# Patient Record
Sex: Female | Born: 1965 | Hispanic: Yes | Marital: Single | Smoking: Never smoker
Health system: Southern US, Community
[De-identification: ages and names within clinical notes are randomized; demographics above are authoritative.]

## PROBLEM LIST (undated history)

## (undated) DIAGNOSIS — D649 Anemia, unspecified: Secondary | ICD-10-CM

## (undated) DIAGNOSIS — M0600 Rheumatoid arthritis without rheumatoid factor, unspecified site: Secondary | ICD-10-CM

## (undated) DIAGNOSIS — R519 Headache, unspecified: Secondary | ICD-10-CM

## (undated) DIAGNOSIS — R51 Headache: Secondary | ICD-10-CM

## (undated) DIAGNOSIS — T8859XA Other complications of anesthesia, initial encounter: Secondary | ICD-10-CM

## (undated) DIAGNOSIS — I10 Essential (primary) hypertension: Secondary | ICD-10-CM

## (undated) DIAGNOSIS — E785 Hyperlipidemia, unspecified: Secondary | ICD-10-CM

## (undated) DIAGNOSIS — T4145XA Adverse effect of unspecified anesthetic, initial encounter: Secondary | ICD-10-CM

## (undated) DIAGNOSIS — G971 Other reaction to spinal and lumbar puncture: Secondary | ICD-10-CM

## (undated) HISTORY — PX: OOPHORECTOMY: SHX86

## (undated) HISTORY — DX: Hyperlipidemia, unspecified: E78.5

## (undated) HISTORY — DX: Rheumatoid arthritis without rheumatoid factor, unspecified site: M06.00

## (undated) HISTORY — PX: APPENDECTOMY: SHX54

---

## 1999-05-17 ENCOUNTER — Other Ambulatory Visit: Admission: RE | Admit: 1999-05-17 | Discharge: 1999-05-17 | Payer: Self-pay | Admitting: Obstetrics

## 1999-08-28 ENCOUNTER — Encounter (INDEPENDENT_AMBULATORY_CARE_PROVIDER_SITE_OTHER): Payer: Self-pay | Admitting: Specialist

## 1999-08-28 ENCOUNTER — Inpatient Hospital Stay (HOSPITAL_COMMUNITY): Admission: AD | Admit: 1999-08-28 | Discharge: 1999-08-30 | Payer: Self-pay | Admitting: Obstetrics

## 2009-10-05 ENCOUNTER — Inpatient Hospital Stay (HOSPITAL_COMMUNITY)
Admission: EM | Admit: 2009-10-05 | Discharge: 2009-10-07 | Payer: Self-pay | Source: Home / Self Care | Admitting: Emergency Medicine

## 2009-10-05 DIAGNOSIS — N12 Tubulo-interstitial nephritis, not specified as acute or chronic: Secondary | ICD-10-CM | POA: Insufficient documentation

## 2009-10-05 DIAGNOSIS — E781 Pure hyperglyceridemia: Secondary | ICD-10-CM | POA: Insufficient documentation

## 2009-10-06 LAB — CONVERTED CEMR LAB
ALT: 29 units/L
CO2: 19 meq/L
Calcium: 8.4 mg/dL
Chloride: 108 meq/L
Potassium: 3.5 meq/L
Sodium: 137 meq/L
TSH: 1.633 microintl units/mL
Total Bilirubin: 0.3 mg/dL
Total Protein: 6.3 g/dL

## 2009-10-07 LAB — CONVERTED CEMR LAB
Basophils Absolute: 0 10*3/uL
HDL: 29 mg/dL
Lymphocytes Relative: 22 %
Neutro Abs: 4.8 10*3/uL
Neutrophils Relative %: 64 %
Platelets: 188 10*3/uL
RDW: 14.3 %
Total CHOL/HDL Ratio: 4.9
Triglycerides: 237 mg/dL
VLDL: 47 mg/dL

## 2009-10-27 ENCOUNTER — Encounter (INDEPENDENT_AMBULATORY_CARE_PROVIDER_SITE_OTHER): Payer: Self-pay | Admitting: Nurse Practitioner

## 2009-10-27 ENCOUNTER — Ambulatory Visit: Payer: Self-pay | Admitting: Internal Medicine

## 2009-10-27 DIAGNOSIS — E119 Type 2 diabetes mellitus without complications: Secondary | ICD-10-CM | POA: Insufficient documentation

## 2009-10-27 DIAGNOSIS — M79609 Pain in unspecified limb: Secondary | ICD-10-CM | POA: Insufficient documentation

## 2009-10-27 DIAGNOSIS — D649 Anemia, unspecified: Secondary | ICD-10-CM | POA: Insufficient documentation

## 2009-10-27 LAB — CONVERTED CEMR LAB
Glucose, Urine, Semiquant: 100
Ketones, urine, test strip: NEGATIVE
Nitrite: NEGATIVE
Specific Gravity, Urine: <1.005
WBC Urine, dipstick: NEGATIVE

## 2009-12-05 ENCOUNTER — Ambulatory Visit: Payer: Self-pay | Admitting: Nurse Practitioner

## 2009-12-05 DIAGNOSIS — I10 Essential (primary) hypertension: Secondary | ICD-10-CM | POA: Insufficient documentation

## 2009-12-05 LAB — CONVERTED CEMR LAB
Bilirubin Urine: NEGATIVE
Microalb, Ur: 0.5 mg/dL (ref 0.00–1.89)
Protein, U semiquant: NEGATIVE
Urobilinogen, UA: 0.2

## 2010-01-18 ENCOUNTER — Ambulatory Visit: Payer: Self-pay | Admitting: Nurse Practitioner

## 2010-01-18 ENCOUNTER — Encounter: Payer: Self-pay | Admitting: Nurse Practitioner

## 2010-01-18 ENCOUNTER — Encounter (INDEPENDENT_AMBULATORY_CARE_PROVIDER_SITE_OTHER): Payer: Self-pay | Admitting: Nurse Practitioner

## 2010-02-17 ENCOUNTER — Encounter (INDEPENDENT_AMBULATORY_CARE_PROVIDER_SITE_OTHER): Payer: Self-pay | Admitting: Nurse Practitioner

## 2010-02-17 ENCOUNTER — Other Ambulatory Visit: Payer: Self-pay | Admitting: Nurse Practitioner

## 2010-02-17 ENCOUNTER — Ambulatory Visit
Admission: RE | Admit: 2010-02-17 | Discharge: 2010-02-17 | Payer: Self-pay | Source: Home / Self Care | Attending: Nurse Practitioner | Admitting: Nurse Practitioner

## 2010-02-17 LAB — CONVERTED CEMR LAB
Bilirubin Urine: NEGATIVE
Blood Glucose, Fingerstick: 124
Glucose, Urine, Semiquant: NEGATIVE
Protein, U semiquant: NEGATIVE
Specific Gravity, Urine: <1.005
pH: 5.5

## 2010-02-20 ENCOUNTER — Encounter (INDEPENDENT_AMBULATORY_CARE_PROVIDER_SITE_OTHER): Payer: Self-pay | Admitting: Nurse Practitioner

## 2010-02-21 ENCOUNTER — Ambulatory Visit (HOSPITAL_COMMUNITY)
Admission: RE | Admit: 2010-02-21 | Discharge: 2010-02-21 | Payer: Self-pay | Source: Home / Self Care | Attending: Internal Medicine | Admitting: Internal Medicine

## 2010-02-28 ENCOUNTER — Other Ambulatory Visit: Payer: Self-pay | Admitting: Internal Medicine

## 2010-02-28 DIAGNOSIS — R928 Other abnormal and inconclusive findings on diagnostic imaging of breast: Secondary | ICD-10-CM

## 2010-02-28 NOTE — Letter (Signed)
Summary: PT INFORMATION SHEET  PT INFORMATION SHEET   Imported By: Arta Bruce 10/28/2009 15:33:06  _____________________________________________________________________  External Attachment:    Type:   Image     Comment:   External Document

## 2010-02-28 NOTE — Assessment & Plan Note (Signed)
Summary: NEW - Establish Care   Vital Signs:  Patient profile:   45 year old female LMP:     10/08/2009 Height:      61.50 inches Weight:      148.4 pounds BMI:     27.69 Temp:     97.1 degrees F oral Pulse rate:   72 / minute Pulse rhythm:   regular Resp:     16 per minute BP sitting:   126 / 78  (left arm) Cuff size:   regular  Vitals Entered By: Levon Hedger (October 27, 2009 11:24 AM)  Nutrition Counseling: Patient's BMI is greater than 25 and therefore counseled on weight management options.  Diabetic Foot Exam Foot Inspection Is there a history of a foot ulcer?              No Is there a foot ulcer now?              No Can the patient see the bottom of their feet?          Yes Are the shoes appropriate in style and fit?          Yes Is there swelling or an abnormal foot shape?          No Are the toenails long?                No Are the toenails thick?                No Are the toenails ingrown?              No Is there heavy callous build-up?              No Is there pain in the calf muscle (Intermittent claudication) when walking?    NoIs there a claw toe deformity?              No Is there elevated skin temperature?            No Is there limited ankle dorsiflexion?            No Is there foot or ankle muscle weakness?            No  Diabetic Foot Care Education Patient educated on appropriate care of diabetic feet.  Pulse Check          Right Foot          Left Foot Dorsalis Pedis:        normal            normal    10-g (5.07) Semmes-Weinstein Monofilament Test           Right Foot          Left Foot Visual Inspection               Test Control      normal         normal Site 1         normal         normal Site 2         normal         normal Site 3         normal         normal Site 4         normal         normal Site 5         normal  normal Site 6         normal         normal Site 7         normal         normal Site 8         normal          normal Site 9         normal         normal Site 10         normal         normal  Impression      normal         normal  Legend:  Site 1 = Plantar aspect of first toe (center of pad) Site 2 = Plantar aspect of third toe (center of pad) Site 3 = Plantar aspect of fifth toe (center of pad) Site 4 = Plantar aspect of first metatarsal head Site 5 = Plantar aspect of third metatarsal head Site 6 = Plantar aspect of fifth metatarsal head Site 7 = Plantar aspect of medial midfoot Site 8 = Plantar aspect of lateral midfoot Site 9 = Plantar aspect of heel Site 10 = dorsal aspect of foot between the base of the first and second toes   Result is Abnormal if patient was unable to perceive the monofilament at site indicated.   CC: follow-up visit [facility]...new diabetic...pt states she has discomfort in heel of left foot, Lipid Management Is Patient Diabetic? Yes Pain Assessment Patient in pain? no      CBG Result 107 CBG Device ID [id number]  Does patient need assistance? Functional Status Self care Ambulation Normal LMP (date): 10/08/2009     Enter LMP: 10/08/2009   CC:  follow-up visit [facility]...new diabetic...pt states she has discomfort in heel of left foot and Lipid Management.  History of Present Illness:  Pt into the office to establish care. No previous PCP  Hospitalized from 10/05/2009 to 10/07/2009 (full hospital d/c reviewed) Pt presented to the ER with pain in her back.  She also had fever. Admits to polyuria and polydipsia.   1. Sepsis secondary to E. coli pyelonephritis - urine culture sensitive to E. coli and pt was started on cipro.  Total course for 2 weeks however pt returns today with 1/2 bottle of meds.  states that the medication gave her headaches so she only took 1 tablet by mouth daily.  2.  Rhabdomylysis - most likely due to sepsis.  no deterioration of her renal function  and CK normalized  3.  Dehydration with tachy - HR 160's in the ER.  She was given rate  controlling medications without much success in bringing down the heart rate.    4.  Type 2 diabetes - done in the hospital.  Pt does have a glucose meter and she has been checking blood sugar before bedtime.  She has been taking metformin 500mg  by mouth daily instead of 1/2 tablet by mouth two times a day as instructed  5.  Hypertriglyceridemia - diet control with low fat, diet and exericise.  6.  Mild normocytic anemia - menstruating female, most likely due to menstrual loss.  no workup done while in the hospital  Pt present with daughter today during the visit who served as the interpreter.  Diabetes Management History:      The patient is a 45 years old female who comes in for evaluation of DM Type 2.  She has not been  enrolled in the "Diabetic Education Program".  She states lack of understanding of dietary principles and is not following her diet appropriately.  No sensory loss is reported.  Self foot exams are not being performed.  She is checking home blood sugars.  She says that she is not exercising regularly.        Hypoglycemic symptoms are not occurring.  No hyperglycemic symptoms are reported.        There are no symptoms to suggest diabetic complications.  The following changes have been made to her treatment plan since last visit: medication changes.  Treatment plan changes were initiated by patient.    Lipid Management History:      Positive NCEP/ATP III risk factors include diabetes and HDL cholesterol less than 40.  Negative NCEP/ATP III risk factors include female age less than 64 years old, non-tobacco-user status, non-hypertensive, no ASHD (atherosclerotic heart disease), no prior stroke/TIA, and no peripheral vascular disease.     Habits & Providers  Alcohol-Tobacco-Diet     Alcohol drinks/day: 0     Tobacco Status: never  Exercise-Depression-Behavior     Does Patient Exercise: no     Have you felt down or hopeless? no     Have you felt little pleasure in things?  no     Drug Use: never  Current Medications (verified): 1)  Cipro 500 Mg Tabs (Ciprofloxacin Hcl) .... Take One Tablet By Mouth Twice Daily *christina Rama 2)  Metformin Hcl 500 Mg Tabs (Metformin Hcl) .... Take One Half Tablet By Mouth Twice Daily *christina Rama  Allergies (verified): No Known Drug Allergies  Family History: mother - diabetes, htn father - deceased from lung disease  sister - diabetes  Social History: 2 children separted tobacco - none ETOH - none Drug - noneDrug Use:  never Smoking Status:  never Does Patient Exercise:  no  Review of Systems CV:  Denies fatigue. Resp:  Denies cough. GI:  Denies abdominal pain, nausea, and vomiting. MS:  left heel pain. Endo:  Complains of excessive thirst and excessive urination.  Physical Exam  General:  alert.   Head:  normocephalic.   Lungs:  normal breath sounds.   Heart:  normal rate and regular rhythm.   Abdomen:  normal bowel sounds.   Msk:  up to the exam table Neurologic:  alert & oriented X3.   Skin:  color normal.   Psych:  Oriented X3.    Diabetes Management Exam:    Foot Exam (with socks and/or shoes not present):       Sensory-Monofilament:          Left foot: normal          Right foot: normal       Inspection:          Left foot: normal          Right foot: normal       Nails:          Left foot: normal          Right foot: normal   Impression & Recommendations:  Problem # 1:  DIABETES MELLITUS, TYPE II (ICD-250.00) will refer to Susie piper diabetes educator advise pt to check blood sugar in the morning before breakfast flu vaccine given today Her updated medication list for this problem includes:    Metformin Hcl 500 Mg Tabs (Metformin hcl) .Marland Kitchen... 1/2 tablet by two times a day for diabetes  Orders: Capillary Blood Glucose/CBG (16109) Misc. Referral (Misc.  Ref)  Problem # 2:  HYPERTRIGLYCERIDEMIA (ICD-272.1) labs reviewed from hospital will recheck in 6 months  Problem # 3:   ANEMIA (ICD-285.9) will rechck on next visit  Problem # 4:  HEEL PAIN, LEFT (ICD-729.5) advised pt to place inserts in her shoes will give ibuprofen as needed   Complete Medication List: 1)  Cipro 500 Mg Tabs (Ciprofloxacin hcl) .... Take one tablet by mouth twice daily *christina rama 2)  Metformin Hcl 500 Mg Tabs (Metformin hcl) .... 1/2 tablet by two times a day for diabetes 3)  Ibuprofen 600 Mg Tabs (Ibuprofen) .... One tablet by mouth two times a day as needed for heel pain  Diabetes Management Assessment/Plan:      The following lipid goals have been established for the patient: Total cholesterol goal of 200; LDL cholesterol goal of 100; HDL cholesterol goal of 40; Triglyceride goal of 150.  Her blood pressure goal is < 130/80.    Lipid Assessment/Plan:      Based on NCEP/ATP III, the patient's risk factor category is "history of diabetes".  The patient's lipid goals are as follows: Total cholesterol goal is 200; LDL cholesterol goal is 100; HDL cholesterol goal is 40; Triglyceride goal is 150.    Patient Instructions: 1)  Schedule an appointment with Susie Piper for diabetes. 2)  Schedule an eligibility appointment to get an orange card 3)  Schedule a follow up appointment with [email or web address] after your eligibility appointment for diabetes. 4)  you will need pneumovax, u/a, microalbumin, cbg, foot check. 5)  Check your blood sugar daily before breakfast  Diabetic Foot Exam Foot Inspection Is there a history of a foot ulcer?              No Is there a foot ulcer now?              No Can the patient see the bottom of their feet?          Yes Are the shoes appropriate in style and fit?          Yes Is there swelling or an abnormal foot shape?          No Are the toenails long?                No Are the toenails thick?                No Are the toenails ingrown?              No Is there heavy callous build-up?              No Is there pain in the calf muscle (Intermittent  claudication) when walking?    NoIs there a claw toe deformity?              No Is there elevated skin temperature?            No Is there limited ankle dorsiflexion?            No Is there foot or ankle muscle weakness?            No  Diabetic Foot Care Education Patient educated on appropriate care of diabetic feet.  Pulse Check          Right Foot          Left Foot Dorsalis Pedis:        normal            normal  10-g (5.07) Semmes-Weinstein Monofilament Test           Right Foot          Left Foot Visual Inspection               Test Control      normal         normal Site 1         normal         normal Site 2         normal         normal Site 3         normal         normal Site 4         normal         normal Site 5         normal         normal Site 6         normal         normal Site 7         normal         normal Site 8         normal         normal Site 9         normal         normal Site 10         normal         normal  Impression      normal         normal  Legend:  Site 1 = Plantar aspect of first toe (center of pad) Site 2 = Plantar aspect of third toe (center of pad) Site 3 = Plantar aspect of fifth toe (center of pad) Site 4 = Plantar aspect of first metatarsal head Site 5 = Plantar aspect of third metatarsal head Site 6 = Plantar aspect of fifth metatarsal head Site 7 = Plantar aspect of medial midfoot Site 8 = Plantar aspect of lateral midfoot Site 9 = Plantar aspect of heel Site 10 = dorsal aspect of foot between the base of the first and second toes   Result is Abnormal if patient was unable to perceive the monofilament at site indicated.   Prescriptions: IBUPROFEN 600 MG TABS (IBUPROFEN) One tablet by mouth two times a day as needed for heel pain  #50 x 0   Entered and Authorized by:   Lehman Prom FNP   Signed by:   Lehman Prom FNP on 10/27/2009   Method used:   Print then Give to Patient   RxID:   [id number] METFORMIN HCL  500 MG TABS (METFORMIN HCL) 1/2 tablet by two times a day for diabetes  #30 x 1   Entered and Authorized by:   Lehman Prom FNP   Signed by:   Lehman Prom FNP on 10/27/2009   Method used:   Print then Give to Patient   RxID:   [id number]   Laboratory Results   Urine Tests  Date/Time Received: October 27, 2009 12:06 PM   Routine Urinalysis   Color: lt. yellow Appearance: Clear Glucose: 100   (Normal Range: Negative) Bilirubin: negative   (Normal Range: Negative) Ketone: negative   (Normal Range: Negative) Spec. Gravity: <1.005   (Normal Range: 1.003-1.035) Blood: negative   (Normal Range: Negative) pH: 7.0   (Normal Range: 5.0-8.0) Protein: negative   (Normal Range: Negative) Urobilinogen: 0.2   (  Normal Range: 0-1) Nitrite: negative   (Normal Range: Negative) Leukocyte Esterace: negative   (Normal Range: Negative)     Blood Tests     CBG Random:: 107      CXR  Procedure date:  10/05/2009  Findings:      no acute findings  Renal US  Procedure date:  10/06/2009  Findings:      no hydronephrosis and but essential normal   CXR  Procedure date:  10/05/2009  Findings:      no acute findings  Renal US  Procedure date:  10/06/2009  Findings:      no hydronephrosis and but essential normal   Appended Document: NEW - Establish Care     Allergies: No Known Drug Allergies   Complete Medication List: 1)  Cipro 500 Mg Tabs (Ciprofloxacin hcl) .... Take one tablet by mouth twice daily *christina rama 2)  Metformin Hcl 500 Mg Tabs (Metformin hcl) .... 1/2 tablet by two times a day for diabetes 3)  Ibuprofen 600 Mg Tabs (Ibuprofen) .... One tablet by mouth two times a day as needed for heel pain  Other Orders: Flu Vaccine 78yrs + (40981) Admin 1st Vaccine (19147) Admin 1st Vaccine [facility]) [phone])    Influenza Vaccine    Vaccine Type: Fluvax 3+    Site: left deltoid    Mfr: [employer]Smith[employer]    Dose: 0.5 ml    Route: IM    Given  by: Levon Hedger    Exp. Date: 06/2010    Lot #: [id number]    VIS given: 08/23/09 version given October 28, 2009.  Flu Vaccine Consent Questions    Do you have a history of severe allergic reactions to this vaccine? no    Any prior history of allergic reactions to egg and/or gelatin? no    Do you have a sensitivity to the preservative Thimersol? no    Do you have a past history of Guillan-Barre Syndrome? no    Do you currently have an acute febrile illness? no    Have you ever had a severe reaction to latex? no    Vaccine information given and explained to patient? yes    Are you currently pregnant? no ndc  [phone]

## 2010-02-28 NOTE — Assessment & Plan Note (Signed)
Summary: Diabetes/HTN   Vital Signs:  Patient profile:   45 year old female Weight:      151.5 pounds BMI:     28.26 Temp:     97.2 degrees F oral Pulse rate:   72 / minute Pulse rhythm:   regular Resp:     16 per minute BP sitting:   140 / 82  (left arm) Cuff size:   regular  Vitals Entered By: Levon Hedger (December 05, 2009 4:03 PM)  Nutrition Counseling: Patient's BMI is greater than 25 and therefore counseled on weight management options. CC: needs DM medication, Lipid Management, Hypertension Management Is Patient Diabetic? Yes Pain Assessment Patient in pain? yes     Location: knees, legs CBG Result 102 CBG Device ID B  Does patient need assistance? Functional Status Self care Ambulation Normal   CC:  needs DM medication, Lipid Management, and Hypertension Management.  History of Present Illness:  Pt into the office for f/u on diabetes She presents today with her medications.  Daugher present today to interpret  Diabetes Management History:      The patient is a 45 years old female who comes in for evaluation of DM Type 2.  She has not been enrolled in the "Diabetic Education Program".  She states lack of understanding of dietary principles and is not following her diet appropriately.  No sensory loss is reported.  Self foot exams are not being performed.  She is checking home blood sugars.  She says that she is not exercising regularly.        Hypoglycemic symptoms are not occurring.  No hyperglycemic symptoms are reported.    Hypertension History:      She denies headache, chest pain, and palpitations.  She notes no problems with any antihypertensive medication side effects.  pt is not taking any medications for blood pressure.        Positive major cardiovascular risk factors include diabetes, hyperlipidemia, and hypertension.  Negative major cardiovascular risk factors include female age less than 75 years old and non-tobacco-user status.        Further  assessment for target organ damage reveals no history of ASHD, stroke/TIA, or peripheral vascular disease.    Lipid Management History:      Positive NCEP/ATP III risk factors include diabetes, HDL cholesterol less than 40, and hypertension.  Negative NCEP/ATP III risk factors include female age less than 84 years old, non-tobacco-user status, no ASHD (atherosclerotic heart disease), no prior stroke/TIA, no peripheral vascular disease, and no history of aortic aneurysm.        Comments include: no current medicationsw.       Allergies: No Known Drug Allergies  Review of Systems Eyes:  Complains of blurring. CV:  Denies chest pain or discomfort. Resp:  Denies cough. GI:  Denies abdominal pain, nausea, and vomiting. Neuro:  Complains of headaches.  Physical Exam  General:  alert.   Head:  normocephalic.   Ears:  ear piercing(s) noted.   Lungs:  normal breath sounds.   Heart:  normal rate and regular rhythm.   Abdomen:  normal bowel sounds.   Msk:  up to the exam table Neurologic:  alert & oriented X3.   Skin:  color normal.   Psych:  Oriented X3.    Diabetes Management Exam:    Foot Exam (with socks and/or shoes not present):       Sensory-Monofilament:          Left foot: normal  Right foot: normal   Impression & Recommendations:  Problem # 1:  DIABETES MELLITUS, TYPE II (ICD-250.00) will increase metformin to 500mg  by mouth two times a day  rec pt to go to free vision screening pneumovax given today Her updated medication list for this problem includes:    Metformin Hcl 500 Mg Tabs (Metformin hcl) ..... One tablet by mouth two times a day for diabetes    Lisinopril 5 Mg Tabs (Lisinopril) ..... One tablet by mouth daily for blood pressure  Orders: Capillary Blood Glucose/CBG ([address]) UA Dipstick w/o Micro (manual) (60454) T-Urine Microalbumin w/creat. ratio [phone]) Misc. Referral (Misc. Ref)  Problem # 2:  HYPERTENSION, BENIGN ESSENTIAL  (ICD-401.1) DASH diet will start low dose ACE Her updated medication list for this problem includes:    Lisinopril 5 Mg Tabs (Lisinopril) ..... One tablet by mouth daily for blood pressure  Complete Medication List: 1)  Metformin Hcl 500 Mg Tabs (Metformin hcl) .... One tablet by mouth two times a day for diabetes 2)  Ibuprofen 600 Mg Tabs (Ibuprofen) .... One tablet by mouth two times a day as needed for heel pain 3)  Lisinopril 5 Mg Tabs (Lisinopril) .... One tablet by mouth daily for blood pressure  Other Orders: Pneumococcal Vaccine (21308) Admin 1st Vaccine (65784)  Diabetes Management Assessment/Plan:      The following lipid goals have been established for the patient: Total cholesterol goal of 200; LDL cholesterol goal of 100; HDL cholesterol goal of 40; Triglyceride goal of 150.  Her blood pressure goal is < 130/80.    Hypertension Assessment/Plan:      The patient's hypertensive risk group is category C: Target organ damage and/or diabetes.  Her calculated 10 year risk of coronary heart disease is 11 %.  Today's blood pressure is 140/82.  Her blood pressure goal is < 130/80.  Lipid Assessment/Plan:      Based on NCEP/ATP III, the patient's risk factor category is "history of diabetes".  The patient's lipid goals are as follows: Total cholesterol goal is 200; LDL cholesterol goal is 100; HDL cholesterol goal is 40; Triglyceride goal is 150.  Her LDL cholesterol goal has been met.    Patient Instructions: 1)  You have been given the pneumovax today. 2)  Blood sugar - increase metformin to 500mg  by mouth two times a day (new prescription given) Better control because you do not have sugar in your urine today 3)  Schedule an appointment with Susie piper - diabetes educator 4)  Try to go to the free vision screening later this week. 5)  Blood pressure - slightly elevated.  Start lisinopril 5mg  by mouth daily.  Monitor sodium in your diet. 6)  Follow up in 6 weeks for diabetes and  blood pressure. will need cbg, foot check, Hgba1c. 7)  need to schedule CPE Prescriptions: METFORMIN HCL 500 MG TABS (METFORMIN HCL) One tablet by mouth two times a day for diabetes  #60 x 1   Entered and Authorized by:   Lehman Prom FNP   Signed by:   Lehman Prom FNP on 12/05/2009   Method used:   Print then Give to Patient   RxID:   [id number] LISINOPRIL 5 MG TABS (LISINOPRIL) One tablet by mouth daily for blood pressure  #30 x 1   Entered and Authorized by:   Lehman Prom FNP   Signed by:   Lehman Prom FNP on 12/05/2009   Method used:   Print then Give to Patient   RxID:  [id number]    Orders Added: 1)  Capillary Blood Glucose/CBG [82948] 2)  Est. Patient Level III [56213] 3)  UA Dipstick w/o Micro (manual) [81002] 4)  T-Urine Microalbumin w/creat. ratio [[id number]] 5)  Misc. Referral [Misc. Ref] 6)  Pneumococcal Vaccine [90732] 7)  Admin 1st Vaccine [90471]   Immunizations Administered:  Pneumonia Vaccine:    Vaccine Type: Pneumovax    Site: right deltoid    Mfr: Merck    Dose: 0.5 ml    Route: IM    Given by: Gaylyn Cheers RN    Exp. Date: 04/17/2011    Lot #: [id number]    VIS given: 01/03/09 version given December 05, 2009.   Immunizations Administered:  Pneumonia Vaccine:    Vaccine Type: Pneumovax    Site: right deltoid    Mfr: Merck    Dose: 0.5 ml    Route: IM    Given by: Gaylyn Cheers RN    Exp. Date: 04/17/2011    Lot #: [id number]    VIS given: 01/03/09 version given December 05, 2009.  Laboratory Results   Urine Tests  Date/Time Received: December 05, 2009 4:18 PM   Routine Urinalysis   Color: lt. yellow Appearance: Clear Glucose: negative   (Normal Range: Negative) Bilirubin: negative   (Normal Range: Negative) Ketone: negative   (Normal Range: Negative) Spec. Gravity: <1.005   (Normal Range: 1.003-1.035) Blood: negative   (Normal Range: Negative) pH: 5.0   (Normal Range: 5.0-8.0) Protein: negative    (Normal Range: Negative) Urobilinogen: 0.2   (Normal Range: 0-1) Nitrite: negative   (Normal Range: Negative) Leukocyte Esterace: negative   (Normal Range: Negative)     Blood Tests     CBG Random:: 102     Prevention & Chronic Care Immunizations   Influenza vaccine: Fluvax 3+  (10/28/2009)    Tetanus booster: Not documented    Pneumococcal vaccine: Pneumovax  (12/05/2009)  Other Screening   Pap smear: Not documented    Mammogram: Not documented   Smoking status: never  (10/27/2009)  Diabetes Mellitus   HgbA1C: 6.9  (10/05/2009)    Eye exam: Not documented    Foot exam: yes  (12/05/2009)   High risk foot: Not documented   Foot care education: Done  (10/27/2009)    Urine microalbumin/creatinine ratio: Not documented  Lipids   Total Cholesterol: 141  (10/07/2009)   LDL: 65  (10/07/2009)   LDL Direct: Not documented   HDL: 29  (10/07/2009)   Triglycerides: 237  (10/07/2009)    SGOT (AST): 22  (10/06/2009)   SGPT (ALT): 29  (10/06/2009)   Alkaline phosphatase: 73  (10/06/2009)   Total bilirubin: 0.3  (10/06/2009)  Hypertension   Last Blood Pressure: 140 / 82  (12/05/2009)   Serum creatinine: 0.62  (10/06/2009)   Serum potassium 3.5  (10/06/2009)  Self-Management Support :    Diabetes self-management support: Not documented    Hypertension self-management support: Not documented    Lipid self-management support: Not documented    Nursing Instructions: Give Pneumovax today    Last LDL:                                                 65 (10/07/2009 10:41:22 AM)          Diabetic Foot Exam Foot Inspection Is there a history of a  foot ulcer?              No Is there a foot ulcer now?              No Is there swelling or an abnormal foot shape?          No Are the toenails long?                No Are the toenails thick?                No Are the toenails ingrown?              No Is there heavy callous build-up?              No Is there a  claw toe deformity?                          No Is there elevated skin temperature?            No Is there limited ankle dorsiflexion?            No Is there foot or ankle muscle weakness?            No Do you have pain in calf while walking?           Yes      Comments: Pain heel of left foot did not see any problems upon visual inspection   10-g (5.07) Semmes-Weinstein Monofilament Test Performed by: Gaylyn Cheers RN          Right Foot          Left Foot Visual Inspection               Test Control      normal         normal Site 1         normal         normal Site 2         normal         normal Site 3         normal         normal Site 4         normal         normal Site 5         normal         normal Site 6         normal         normal Site 7         normal         normal Site 8         normal         normal Site 9         normal         normal Site 10         normal         normal  Impression      normal         normal

## 2010-03-02 NOTE — Progress Notes (Signed)
Summary: Office Visit//DEPRESSION [facility]  Office Visit//DEPRESSION SCREENNG   Imported By: Arta Bruce 02/20/2010 10:02:54  _____________________________________________________________________  External Attachment:    Type:   Image     Comment:   External Document

## 2010-03-02 NOTE — Letter (Signed)
Summary: *HSN Results Follow up  [facility]  [address] [place], [place] [address]   Phone: [phone]  Fax: [phone]      02/20/2010   Charlsey Lapenna [address] [place], [place]  [address]   Dear  Ms. Jalonda Interrante,                            ____S.Drinkard,FNP   ____D. Gore,FNP       ____B. McPherson,MD   ____V. Rankins,MD    ____E. Mulberry,MD    _ X___N. Daphine Deutscher, FNP  ____D. Reche Dixon, MD    ____K. Philipp Deputy, MD    ____Other     This letter is to inform you that your recent test(s):  ___X____Pap Smear    ____X___Lab Test     _______X-ray    ___X___ is within acceptable limits  _______ requires a medication change  _______ requires a follow-up lab visit  _______ requires a follow-up visit with your provider   Comments:  Labs done during recent office visit are normal.  Pap results are _________________________.       _________________________________________________________ If you have any questions, please contact our office [phone].                    Sincerely,    Lehman Prom FNP [facility]

## 2010-03-02 NOTE — Assessment & Plan Note (Signed)
Summary: Diabetes   Vital Signs:  Patient profile:   45 year old female Height:      61.50 inches Weight:      149.7 pounds BMI:     27.93 Temp:     97.0 degrees F oral Pulse rate:   76 / minute Pulse rhythm:   regular Resp:     18 per minute BP sitting:   122 / 78  (left arm) Cuff size:   regular  Vitals Entered By: [place] Shannon (January 18, 2010 4:18 PM)  Nutrition Counseling: Patient's BMI is greater than 25 and therefore counseled on weight management options. CC: follow up, Hypertension Management, Lipid Management Is Patient Diabetic? Yes Pain Assessment Patient in pain? no      CBG Result 144  Does patient need assistance? Functional Status Self care Ambulation Normal   CC:  follow up, Hypertension Management, and Lipid Management.  History of Present Illness:  Pt into the office for f/u on diabetes  Daughters present with pt during the exam who speak English  Diabetes Management History:      The patient is a 45 years old female who comes in for evaluation of Type 2 Diabetes Mellitus.  She has not been enrolled in the "Diabetic Education Program".  She states understanding of dietary principles and is following her diet appropriately.  No sensory loss is reported.  Self foot exams are not being performed.  She is checking home blood sugars.  She says that she is not exercising regularly.        Her home blood sugars include fasting blood sugars: highest: 223, lowest: 96.    Hypertension History:      She denies headache, chest pain, and palpitations.  She notes no problems with any antihypertensive medication side effects.        Positive major cardiovascular risk factors include diabetes, hyperlipidemia, and hypertension.  Negative major cardiovascular risk factors include female age less than 58 years old and non-tobacco-user status.        Further assessment for target organ damage reveals no history of ASHD, stroke/TIA, or peripheral vascular disease.     Lipid Management History:      Positive NCEP/ATP III risk factors include diabetes, HDL cholesterol less than 40, and hypertension.  Negative NCEP/ATP III risk factors include female age less than 61 years old, non-tobacco-user status, no ASHD (atherosclerotic heart disease), no prior stroke/TIA, no peripheral vascular disease, and no history of aortic aneurysm.     Allergies: No Known Drug Allergies  Review of Systems General:  +dizziness at time with standing pt drinks 4 bottles of water daily. ENT:  Denies decreased hearing and earache. CV:  Denies fatigue. Resp:  Denies cough. GI:  Denies abdominal pain, nausea, and vomiting.  Physical Exam  General:  alert.   Head:  normocephalic.   Lungs:  normal breath sounds.   Heart:  normal rate and regular rhythm.   Abdomen:  normal bowel sounds.   Msk:  normal ROM.   Neurologic:  alert & oriented X3.   Skin:  color normal.   Psych:  Oriented X3.     Impression & Recommendations:  Problem # 1:  DIABETES MELLITUS, TYPE II (ICD-250.00) Hgba1c = 6.1 pt doing well with checking blood sugar and monitoring her diet Her updated medication list for this problem includes:    Metformin Hcl 500 Mg Tabs (Metformin hcl) ..... One tablet by mouth two times a day for diabetes    Lisinopril  5 Mg Tabs (Lisinopril) ..... One tablet by mouth daily for blood pressure  Orders: UA Dipstick w/o Micro (manual) ([address]) Hemoglobin A1C (83036)  Problem # 2:  HYPERTENSION, BENIGN ESSENTIAL (ICD-401.1) BP is stable  DASH stable advised pt to take medication as night. Her updated medication list for this problem includes:    Lisinopril 5 Mg Tabs (Lisinopril) ..... One tablet by mouth daily for blood pressure  Problem # 3:  ANEMIA (ICD-285.9) stable  Complete Medication List: 1)  Metformin Hcl 500 Mg Tabs (Metformin hcl) .... One tablet by mouth two times a day for diabetes 2)  Ibuprofen 600 Mg Tabs (Ibuprofen) .... One tablet by mouth two times  a day as needed for heel pain 3)  Lisinopril 5 Mg Tabs (Lisinopril) .... One tablet by mouth daily for blood pressure  Diabetes Management Assessment/Plan:      The following lipid goals have been established for the patient: Total cholesterol goal of 200; LDL cholesterol goal of 100; HDL cholesterol goal of 40; Triglyceride goal of 150.  Her blood pressure goal is < 130/80.    Hypertension Assessment/Plan:      The patient's hypertensive risk group is category C: Target organ damage and/or diabetes.  Her calculated 10 year risk of coronary heart disease is 8 %.  Today's blood pressure is 122/78.  Her blood pressure goal is < 130/80.  Lipid Assessment/Plan:      Based on NCEP/ATP III, the patient's risk factor category is "history of diabetes".  The patient's lipid goals are as follows: Total cholesterol goal is 200; LDL cholesterol goal is 100; HDL cholesterol goal is 40; Triglyceride goal is 150.  Her LDL cholesterol goal has been met.     Patient Instructions: 1)  Diabetes - Thank you for checking your blood sugar daily before breakfast. 2)  Blood sugar is doing well. 3)  Your Hgba1c = 6.1  This should be less than 7 so you are doing great. 4)  Blood pressure - Take your blood pressure medications at night. 5)  Schedule an appointment for a complete physical exam. 6)  You will need PAP, mammogram, u/a. Prescriptions: IBUPROFEN 600 MG TABS (IBUPROFEN) One tablet by mouth two times a day as needed for heel pain  #50 x 0   Entered and Authorized by:   Lehman Prom FNP   Signed by:   Lehman Prom FNP on 01/18/2010   Method used:   Print then Give to Patient   RxID:   [id number] LISINOPRIL 5 MG TABS (LISINOPRIL) One tablet by mouth daily for blood pressure  #90 x 1   Entered and Authorized by:   Lehman Prom FNP   Signed by:   Lehman Prom FNP on 01/18/2010   Method used:   Print then Give to Patient   RxID:   [id number] METFORMIN HCL 500 MG TABS (METFORMIN HCL)  One tablet by mouth two times a day for diabetes  #180 x 1   Entered and Authorized by:   Lehman Prom FNP   Signed by:   Lehman Prom FNP on 01/18/2010   Method used:   Print then Give to Patient   RxID:   [id number]    Orders Added: 1)  Est. Patient Level III [25366] 2)  UA Dipstick w/o Micro (manual) [81002] 3)  Hemoglobin A1C [83036]     Last LDL:  65 (10/07/2009 10:41:22 AM)      Diabetic Foot Exam Last Podiatry Exam Date: 01/18/2010  Foot Inspection Is there a history of a foot ulcer?              No Is there a foot ulcer now?              No Can the patient see the bottom of their feet?          Yes Are the shoes appropriate in style and fit?          Yes Is there swelling or an abnormal foot shape?          No Are the toenails long?                No Are the toenails thick?                No Are the toenails ingrown?              No Is there heavy callous build-up?              No Is there a claw toe deformity?                          No Is there elevated skin temperature?            No Is there limited ankle dorsiflexion?            No Is there foot or ankle muscle weakness?            No Do you have pain in calf while walking?           No        Laboratory Results   Blood Tests   Date/Time Received: January 18, 2010 5:11 PM   HGBA1C: 6.2%   (Normal Range: Non-Diabetic - 3-6%   Control Diabetic - 6-8%) CBG Random:: 144mg /dL

## 2010-03-02 NOTE — Assessment & Plan Note (Signed)
Summary: Complete Physical Exam   Vital Signs:  Patient profile:   45 year old female Menstrual status:  regular LMP:     02/04/2010 Weight:      149.7 pounds Temp:     97.1 degrees F oral Pulse rate:   96 / minute Pulse rhythm:   regular Resp:     20 per minute BP sitting:   130 / 80  (left arm) Cuff size:   regular  Vitals Entered By: Levon Hedger (February 17, 2010 3:17 PM) CC: CPP, Hypertension Management, Lipid Management Is Patient Diabetic? No Pain Assessment Patient in pain? no      CBG Result 124 CBG Device ID B  Does patient need assistance? Functional Status Self care Ambulation Normal  Vision Screening:Left eye w/o correction: 20 / 25-1 Right Eye w/o correction: 20 / 40 Both eyes w/o correction:  20/ 25        Vision Entered By: Levon Hedger (February 17, 2010 3:35 PM) LMP (date): 02/04/2010 LMP - Character: normal     Menstrual flow (days): 8 Menstrual Status regular Enter LMP: 02/04/2010   CC:  CPP, Hypertension Management, and Lipid Management.  History of Present Illness:  Pt is into the office for a complete physical exam  Pap - Last PAP smear was many yeas ago No family history of cervical or ovarian cancer  Mammogram - never had a mammogram before self breast exam placcard given to pt no family history of breast cancer  Menses - monthly, regular  Optho - no glasses or contacts  Dental - last dental exam was 1 year in [place]. [place]  Tet[place] - last done 5 years ago  Presents today with her 2 daughers who assist in interpreting for her - Spanish  Diabetes Management History:      The patient is a 45 years old female who comes in for evaluation of Type 2 Diabetes Mellitus.  She has not been enrolled in the "Diabetic Education Program".  She states understanding of dietary principles and is following her diet appropriately.  No sensory loss is reported.  Self foot exams are not being performed.  She is checking home blood  sugars.  She says that she is not exercising regularly.        Hypoglycemic symptoms are not occurring.  No hyperglycemic symptoms are reported.        No changes have been made to her treatment plan since last visit.    Hypertension History:      She denies headache, chest pain, and palpitations.        Positive major cardiovascular risk factors include diabetes, hyperlipidemia, and hypertension.  Negative major cardiovascular risk factors include female age less than 45 years old and non-tobacco-user status.        Further assessment for target organ damage reveals no history of ASHD, cardiac end-organ damage (CHF/LVH), stroke/TIA, peripheral vascular disease, renal insufficiency, or hypertensive retinopathy.    Lipid Management History:      Positive NCEP/ATP III risk factors include diabetes, HDL cholesterol less than 40, and hypertension.  Negative NCEP/ATP III risk factors include female age less than 54 years old, non-tobacco-user status, no ASHD (atherosclerotic heart disease), no prior stroke/TIA, no peripheral vascular disease, and no history of aortic aneurysm.        Comments include: no current meds - pt is managing with diet.      Habits & Providers  Alcohol-Tobacco-Diet     Alcohol drinks/day: 0  Tobacco Status: never  Exercise-Depression-Behavior     Does Patient Exercise: yes     Exercise Counseling: to improve exercise regimen     Have you felt down or hopeless? no     Have you felt little pleasure in things? no     Depression Counseling: not indicated; screening negative for depression     Drug Use: never  Comments: PHQ-9 score = 4  Allergies (verified): No Known Drug Allergies  Social History: Does Patient Exercise:  yes  Review of Systems General:  Denies fever. Eyes:  Denies blurring. ENT:  Denies earache. CV:  Denies chest pain or discomfort. Resp:  Denies cough. GI:  Denies abdominal pain, nausea, and vomiting. GU:  Denies discharge. MS:   Denies joint pain. Derm:  Denies dryness. Neuro:  Denies headaches. Psych:  Denies anxiety and depression.  Physical Exam  General:  alert.   Head:  normocephalic.   Eyes:  pupils round.   Ears:  minimal cerumen bilaterally Nose:  no nasal discharge.   Mouth:  pharynx pink and moist.   Neck:  supple.   Chest Wall:  no mass.   Breasts:  no abnormal thickening.   Lungs:  normal breath sounds.   Heart:  normal rate and regular rhythm.   Abdomen:  soft, non-tender, and normal bowel sounds.   Rectal:  no external abnormalities.   Msk:  normal ROM.   Extremities:  no edema Neurologic:  alert & oriented X3, cranial nerves II-XII intact, and gait normal.   Skin:  color normal.   Psych:  Oriented X3.    Pelvic Exam  Vulva:      normal appearance.   Urethra and Bladder:      Urethra--normal.   Vagina:      physiologic discharge.   Cervix:      midposition.   Uterus:      smooth.   Adnexa:      nontender bilaterally.   Rectum:      normal, heme negative stool.    Diabetes Management Exam:    Foot Exam (with socks and/or shoes not present):       Sensory-Monofilament:          Left foot: normal          Right foot: normal    Impression & Recommendations:  Problem # 1:  ROUTINE GYNECOLOGICAL EXAMINATION (ICD-V72.31) PHQ-9 score 4 PAP done rec optho nd dental exam Orders: Vision Screening (16109) T-Syphilis Test (RPR) (60454-09811) Rapid HIV  (91478) T-Chlamydia Probe, genital (29562-13086) Mammogram (Screening) (Mammo)  Problem # 2:  UNSPECIFIED BREAST SCREENING (ICD-V76.10) self breast exam placcard given to pt mammogram ordered  Problem # 3:  DIABETES MELLITUS, TYPE II (ICD-250.00) continue current meds Her updated medication list for this problem includes:    Metformin Hcl 500 Mg Tabs (Metformin hcl) ..... One tablet by mouth two times a day for diabetes    Lisinopril 5 Mg Tabs (Lisinopril) ..... One tablet by mouth daily for blood  pressure  Orders: EKG w/ Interpretation (93000) UA Dipstick w/o Micro (manual) (57846) Capillary Blood Glucose/CBG (96295) Capillary Blood Glucose/CBG (28413)  Problem # 4:  HYPERTRIGLYCERIDEMIA (ICD-272.1) diet control for now  Problem # 5:  HYPERTENSION, BENIGN ESSENTIAL (ICD-401.1)  Her updated medication list for this problem includes:    Lisinopril 5 Mg Tabs (Lisinopril) ..... One tablet by mouth daily for blood pressure  Complete Medication List: 1)  Metformin Hcl 500 Mg Tabs (Metformin hcl) .... One tablet by  mouth two times a day for diabetes 2)  Ibuprofen 600 Mg Tabs (Ibuprofen) .... One tablet by mouth two times a day as needed for heel pain 3)  Lisinopril 5 Mg Tabs (Lisinopril) .... One tablet by mouth daily for blood pressure  Diabetes Management Assessment/Plan:      The following lipid goals have been established for the patient: Total cholesterol goal of 200; LDL cholesterol goal of 100; HDL cholesterol goal of 40; Triglyceride goal of 150.  Her blood pressure goal is < 130/80.    Hypertension Assessment/Plan:      The patient's hypertensive risk group is category C: Target organ damage and/or diabetes.  Her calculated 10 year risk of coronary heart disease is 8 %.  Today's blood pressure is 130/80.  Her blood pressure goal is < 130/80.  Lipid Assessment/Plan:      Based on NCEP/ATP III, the patient's risk factor category is "history of diabetes".  The patient's lipid goals are as follows: Total cholesterol goal is 200; LDL cholesterol goal is 100; HDL cholesterol goal is 40; Triglyceride goal is 150.  Her LDL cholesterol goal has been met.    Patient Instructions: 1)  Call for a follow up in 3 months for diabetes 2)  you will need cbg, hgba1c, u/a. 3)  Keep up the good work at diet and exercise. Prescriptions: METFORMIN HCL 500 MG TABS (METFORMIN HCL) One tablet by mouth two times a day for diabetes  #180 x 1   Entered and Authorized by:   Lehman Prom FNP    Signed by:   Lehman Prom FNP on 02/17/2010   Method used:   Print then Give to Patient   RxID:   [id number]    Orders Added: 1)  Est. Patient age 78-64 [99396] 2)  EKG w/ Interpretation [93000] 3)  UA Dipstick w/o Micro (manual) [81002] 4)  Capillary Blood Glucose/CBG [82948] 5)  Vision Screening [99173] 6)  Capillary Blood Glucose/CBG [82948] 7)  T-Syphilis Test (RPR) [14782-95621] 8)  Rapid HIV  [92370] 9)  T-Chlamydia Probe, genital [30865-78469] 10)  Mammogram (Screening) [Mammo]    Laboratory Results   Urine Tests  Date/Time Received: February 17, 2010 3:36 PM   Routine Urinalysis   Color: lt. yellow Appearance: Clear Glucose: negative   (Normal Range: Negative) Bilirubin: negative   (Normal Range: Negative) Ketone: negative   (Normal Range: Negative) Spec. Gravity: <1.005   (Normal Range: 1.003-1.035) Blood: negative   (Normal Range: Negative) pH: 5.5   (Normal Range: 5.0-8.0) Protein: negative   (Normal Range: Negative) Urobilinogen: 0.2   (Normal Range: 0-1) Nitrite: negative   (Normal Range: Negative) Leukocyte Esterace: negative   (Normal Range: Negative)     Blood Tests     CBG Random:: 124  Date/Time Received: February 17, 2010 5:40 PM   Wet [place]/KOH Source: vaginal WBC/hpf: 1-5 Bacteria/hpf: rare Clue cells/hpf: none Yeast/hpf: none Trichomonas/hpf: none  Other Tests  Rapid HIV: negative    Last LDL:                                                 65 (10/07/2009 10:41:22 AM)        Diabetic Foot Exam Last Podiatry Exam Date: 01/18/2010    10-g (5.07) Semmes-Weinstein Monofilament Test Performed by: Levon Hedger          Right Foot  Left Foot Visual Inspection               Test Control      normal         normal Site 1         normal         normal Site 2         normal         normal Site 3         normal         normal Site 4         normal         normal Site 5         normal         normal Site 6          normal         normal Site 7         normal         normal Site 8         normal         normal Site 9         normal         normal Site 10         normal         normal  Impression      normal         normal   Laboratory Results   Urine Tests    Routine Urinalysis   Color: lt. yellow Appearance: Clear Glucose: negative   (Normal Range: Negative) Bilirubin: negative   (Normal Range: Negative) Ketone: negative   (Normal Range: Negative) Spec. Gravity: <1.005   (Normal Range: 1.003-1.035) Blood: negative   (Normal Range: Negative) pH: 5.5   (Normal Range: 5.0-8.0) Protein: negative   (Normal Range: Negative) Urobilinogen: 0.2   (Normal Range: 0-1) Nitrite: negative   (Normal Range: Negative) Leukocyte Esterace: negative   (Normal Range: Negative)     Blood Tests     CBG Random:: 124mg /dL    DIRECTV KOH: Negative  Other Tests  Rapid HIV: negative

## 2010-03-30 ENCOUNTER — Encounter (INDEPENDENT_AMBULATORY_CARE_PROVIDER_SITE_OTHER): Payer: Self-pay | Admitting: Nurse Practitioner

## 2010-04-04 ENCOUNTER — Ambulatory Visit
Admission: RE | Admit: 2010-04-04 | Discharge: 2010-04-04 | Disposition: A | Discharge status: Home / Self Care | Payer: Self-pay | Source: Ambulatory Visit | Attending: Internal Medicine | Admitting: Internal Medicine

## 2010-04-04 DIAGNOSIS — R928 Other abnormal and inconclusive findings on diagnostic imaging of breast: Secondary | ICD-10-CM

## 2010-04-13 LAB — COMPREHENSIVE METABOLIC PANEL
ALT: 29 U/L (ref 0–35)
AST: 22 U/L (ref 0–37)
AST: 29 U/L (ref 0–37)
Albumin: 2.9 g/dL — ABNORMAL LOW (ref 3.5–5.2)
Albumin: 3.4 g/dL — ABNORMAL LOW (ref 3.5–5.2)
CO2: 19 mEq/L (ref 19–32)
CO2: 19 mEq/L (ref 19–32)
Calcium: 8.4 mg/dL (ref 8.4–10.5)
Calcium: 8.5 mg/dL (ref 8.4–10.5)
Creatinine, Ser: 0.78 mg/dL (ref 0.4–1.2)
GFR calc Af Amer: >60 mL/min (ref >60)
GFR calc non Af Amer: >60 mL/min (ref >60)
GFR calc non Af Amer: >60 mL/min (ref >60)
Sodium: 137 mEq/L (ref 135–145)
Total Protein: 7 g/dL (ref 6.0–8.3)

## 2010-04-13 LAB — GLUCOSE, CAPILLARY
Glucose-Capillary: 111 mg/dL — ABNORMAL HIGH (ref 70–99)
Glucose-Capillary: 124 mg/dL — ABNORMAL HIGH (ref 70–99)
Glucose-Capillary: 138 mg/dL — ABNORMAL HIGH (ref 70–99)
Glucose-Capillary: 145 mg/dL — ABNORMAL HIGH (ref 70–99)
Glucose-Capillary: 150 mg/dL — ABNORMAL HIGH (ref 70–99)
Glucose-Capillary: 161 mg/dL — ABNORMAL HIGH (ref 70–99)

## 2010-04-13 LAB — URINE MICROSCOPIC-ADD ON

## 2010-04-13 LAB — DIFFERENTIAL
Eosinophils Absolute: 0.1 10*3/uL (ref 0.0–0.7)
Eosinophils Relative: 0 % (ref 0–5)
Eosinophils Relative: 2 % (ref 0–5)
Lymphocytes Relative: 4 % — ABNORMAL LOW (ref 12–46)
Lymphs Abs: 0.6 10*3/uL — ABNORMAL LOW (ref 0.7–4.0)
Lymphs Abs: 1.7 10*3/uL (ref 0.7–4.0)
Monocytes Absolute: 0.9 10*3/uL (ref 0.1–1.0)
Monocytes Relative: 12 % (ref 3–12)
Monocytes Relative: 4 % (ref 3–12)

## 2010-04-13 LAB — LIPID PANEL
HDL: 29 mg/dL — ABNORMAL LOW (ref >39)
Total CHOL/HDL Ratio: 4.9 RATIO
Triglycerides: 237 mg/dL — ABNORMAL HIGH (ref <150)
VLDL: 47 mg/dL — ABNORMAL HIGH (ref 0–40)

## 2010-04-13 LAB — CBC
HCT: 30.9 % — ABNORMAL LOW (ref 36.0–46.0)
HCT: 34.7 % — ABNORMAL LOW (ref 36.0–46.0)
Hemoglobin: 10.3 g/dL — ABNORMAL LOW (ref 12.0–15.0)
Hemoglobin: 11.7 g/dL — ABNORMAL LOW (ref 12.0–15.0)
MCH: 27 pg (ref 26.0–34.0)
MCHC: 32.9 g/dL (ref 30.0–36.0)
MCV: 80 fL (ref 78.0–100.0)
Platelets: 166 10*3/uL (ref 150–400)
RBC: 4.34 MIL/uL (ref 3.87–5.11)
RDW: 14.3 % (ref 11.5–15.5)
WBC: 13.7 10*3/uL — ABNORMAL HIGH (ref 4.0–10.5)
WBC: 7.5 10*3/uL (ref 4.0–10.5)

## 2010-04-13 LAB — CULTURE, BLOOD (ROUTINE X 2): Culture: NO GROWTH

## 2010-04-13 LAB — POCT PREGNANCY, URINE: Preg Test, Ur: NEGATIVE

## 2010-04-13 LAB — BASIC METABOLIC PANEL
CO2: 21 mEq/L (ref 19–32)
Calcium: 7.8 mg/dL — ABNORMAL LOW (ref 8.4–10.5)
Creatinine, Ser: 0.55 mg/dL (ref 0.4–1.2)
Glucose, Bld: 132 mg/dL — ABNORMAL HIGH (ref 70–99)

## 2010-04-13 LAB — URINALYSIS, ROUTINE W REFLEX MICROSCOPIC
Bilirubin Urine: NEGATIVE
Glucose, UA: 500 mg/dL — AB
Nitrite: NEGATIVE
Specific Gravity, Urine: 1.006 (ref 1.005–1.030)
pH: 7 (ref 5.0–8.0)

## 2010-04-13 LAB — HEMOGLOBIN A1C: Hgb A1c MFr Bld: 6.9 % — ABNORMAL HIGH (ref <5.7)

## 2010-04-13 LAB — URINE CULTURE

## 2010-04-13 LAB — PROCALCITONIN: Procalcitonin: 0.28 ng/mL

## 2010-04-13 LAB — POCT CARDIAC MARKERS
CKMB, poc: 1.4 ng/mL (ref 1.0–8.0)
Myoglobin, poc: 382 ng/mL (ref 12–200)
Myoglobin, poc: >500 ng/mL (ref 12–200)

## 2010-04-13 LAB — T3, FREE: T3, Free: 2 pg/mL — ABNORMAL LOW (ref 2.3–4.2)

## 2010-04-13 LAB — MRSA PCR SCREENING: MRSA by PCR: NEGATIVE

## 2010-06-16 NOTE — Discharge Summary (Signed)
[facility]  Patient:    Andrea Mclaughlin, Andrea Mclaughlin                          MRN: [id number] Adm. Date:  [id number] Disc. Date: [id number] Attending:  Tammi Sou Dictator:   Lyndee Leo. Janey Greaser, M.D.                           Discharge Summary  HOSPITAL COURSE:              This is a 45 year old female, G2, P1-0-0-1, who is admitted on August 28, 1999, at 37-5/7 weeks, who is admitted for active labor.  The patient has a previous unknown uterine scar and was told by a previous OB/GYN that she was not to labor again. As a result, the patient had a scheduled low transverse cesarean section as well as bilateral tubal ligation.  The patient had a viable female with Apgars of 8 and 9.  The patient tolerated the procedure without any problems and had an uncomplicated hospital course.  The patient is going to breast-feed her baby and, again, BTL for contraception.  DISCHARGE MEDICATIONS:        Ibuprofen.  DISCHARGE LABORATORY WORK:    Hemoglobin 10.3, hematocrit 31.3.  FOLLOW-UP:                    The patient is to follow up with her OB/GYN in six weeks.  DISCHARGE DIAGNOSIS:          Term pregnancy delivered by low transverse cesarean section.  Plan routine postpartum care. DD:  08/30/99 TD:  09/01/99 Job: [id number]

## 2010-06-16 NOTE — Op Note (Signed)
[facility]  Patient:    Andrea Mclaughlin                           MRN: [id number] Proc. Date: 08/28/99 Adm. Date:  [id number] Attending:  Tammi Sou CC:         Ob/Gyn Teaching Service office at [facility], Atten:                           Operative Report  PREOPERATIVE DIAGNOSIS:       A 37-week gestation, previous cesarean section                               with unknown uterine scar, early labor, and told                               by previous obstetrician not to labor again,                               desires sterilization.  POSTOPERATIVE DIAGNOSIS:      A 37-week gestation, previous cesarean section                               with unknown uterine scar, early labor, and told                               by previous obstetrician not to labor again,                               desires sterilization.  OPERATION:                    Repeat low transverse cesarean section and                               bilateral partial salpingectomy ([place]                               technique).  SURGEON:                      Charles A. Clearance Coots, M.D.  ASSISTANT:                    Jamey Reas, M.D.  ANESTHESIA:                   Spinal.  ESTIMATED BLOOD LOSS:         700 ml.  IV FLUIDS:                    3000 ml.  URINE OUT:                    150 ml clear.  COMPLICATIONS:                None.  FOLEY:  To gravity.  SPECIMENS:                    Approximately 2 cm segments of right and left fallopian tubes.  FINDINGS:                     Viable female at 88.  Apgars of 8 at one minute and 9 at five minutes.  Weight of 6 pounds and 7 ounces.  Normal uterus, ovaries, and fallopian tubes.  DESCRIPTION OF PROCEDURE:     The patient was brought to the operating room and after satisfactory spinal anesthesia, the abdomen was prepped and draped in the usual sterile fashion.  A Pfannenstiel skin  incision was made with the scalpel that was deepened down to the fascia with the scalpel.  The fascia was nicked in the midline and the fascial incision was extended to the left and to the right with curved Mayo scissors.  Superior and inferior fascial edges were taken off of the rectus muscles with both blunt and sharp dissection.  The rectus muscle was bluntly and sharply divided in the midline, being careful to avoid the urinary bladder inferiorly.  The peritoneum was then entered digitally and was digitally extended to the left and to the right.  The bladder blade was positioned and the vesicouterine fold with peritoneum above the reflection of the urinary bladder was grasped with forceps, and was incised and undermined with Metzenbaum scissors.  The incision was extended to the left and to the right with the Metzenbaum scissors.  The bladder flap was bluntly developed and the bladder blade was repositioned in front of the urinary bladder, placing it well out of the operative field.  The uterus was entered with sharp strokes of the scalpel.  The uterine incision was extended to the left and to the right in a smile configuration with the bandage scissors.  The vertex was then delivered with the aid of fundal pressure from the assistant.  The delivery was completed with the aid of fundal pressure from the assistant.  The infants mouth and nose was suctioned with the suction bulb and the umbilical cord was clamped with two Kelly forceps and it was cut, and the infant was handed off to the nursing staff.                                Cord blood was obtained and the placenta was spontaneously expelled from the uterine cavity intact.  The uterus was exteriorized and the endometrial surface was thoroughly debrided with a dry lap sponge.  The cervix was dilated with the [facility] forceps.  The uterine incision was grasped with ring forceps, and the uterus was closed with continuous interlocking  suture of 0 Monocryl from each corner to the center. Hemostasis was excellent.                                Attention was then turned above to the tubal ligation procedure.  The left fallopian tube was identified for the cornual end to the fimbrial end, and was then grasped in the isthmic area with a Babcock clamp, and the knuckle of tube beneath the Babcock clamp was doubly ligated with #1 plain catgut, and the section of tube above the knot was excised with Metzenbaum scissors and submitted to pathology for evaluation. The same  procedure was performed on the opposite side without complication. The uterus was then placed back in its normal anatomic position.  The pelvic cavity was thoroughly irrigated with warm saline solution and all clots were removed.  The abdomen was then closed as follows; the rectus muscle was approximated with a few interrupted sutures of 0 Monocryl.  The fascia was closed with continuous suture of 0 PDS from each corner to the center.  The subcutaneous tissue was thoroughly irrigated with warm saline solution and all areas of subcutaneous bleeding were coagulated with the Bovie.  The skin was then approximated with surgical stainless steel staples.  A sterile bandage was applied to the incision closure.                                The patient tolerated the procedure well.  The surgical technician indicated that all needle, sponge, and instrument counts were correct.  The patient was then taken to the recovery room in satisfactory condition. DD:  08/28/99 TD:  08/29/99 Job: [id number]

## 2011-06-01 ENCOUNTER — Encounter (HOSPITAL_COMMUNITY): Payer: Self-pay | Admitting: *Deleted

## 2011-06-01 ENCOUNTER — Inpatient Hospital Stay (HOSPITAL_COMMUNITY): Payer: Self-pay

## 2011-06-01 ENCOUNTER — Inpatient Hospital Stay (HOSPITAL_COMMUNITY)
Admission: AD | Admit: 2011-06-01 | Discharge: 2011-06-01 | Disposition: A | Discharge status: Home / Self Care | Payer: Self-pay | Source: Ambulatory Visit | Attending: Obstetrics & Gynecology | Admitting: Obstetrics & Gynecology

## 2011-06-01 DIAGNOSIS — D219 Benign neoplasm of connective and other soft tissue, unspecified: Secondary | ICD-10-CM

## 2011-06-01 DIAGNOSIS — D259 Leiomyoma of uterus, unspecified: Secondary | ICD-10-CM

## 2011-06-01 DIAGNOSIS — N898 Other specified noninflammatory disorders of vagina: Secondary | ICD-10-CM

## 2011-06-01 DIAGNOSIS — D649 Anemia, unspecified: Secondary | ICD-10-CM | POA: Insufficient documentation

## 2011-06-01 DIAGNOSIS — D250 Submucous leiomyoma of uterus: Secondary | ICD-10-CM | POA: Insufficient documentation

## 2011-06-01 DIAGNOSIS — N938 Other specified abnormal uterine and vaginal bleeding: Secondary | ICD-10-CM | POA: Insufficient documentation

## 2011-06-01 DIAGNOSIS — N949 Unspecified condition associated with female genital organs and menstrual cycle: Secondary | ICD-10-CM | POA: Insufficient documentation

## 2011-06-01 DIAGNOSIS — N939 Abnormal uterine and vaginal bleeding, unspecified: Secondary | ICD-10-CM

## 2011-06-01 HISTORY — DX: Adverse effect of unspecified anesthetic, initial encounter: T41.45XA

## 2011-06-01 HISTORY — DX: Other complications of anesthesia, initial encounter: T88.59XA

## 2011-06-01 HISTORY — DX: Other reaction to spinal and lumbar puncture: G97.1

## 2011-06-01 HISTORY — DX: Anemia, unspecified: D64.9

## 2011-06-01 HISTORY — DX: Essential (primary) hypertension: I10

## 2011-06-01 LAB — CBC
MCH: 18.4 pg — ABNORMAL LOW (ref 26.0–34.0)
MCHC: 28.2 g/dL — ABNORMAL LOW (ref 30.0–36.0)
MCV: 65.2 fL — ABNORMAL LOW (ref 78.0–100.0)
Platelets: 331 10*3/uL (ref 150–400)
RDW: 19 % — ABNORMAL HIGH (ref 11.5–15.5)

## 2011-06-01 MED ORDER — MEDROXYPROGESTERONE ACETATE 10 MG PO TABS: 20.0000 mg | ORAL_TABLET | Freq: Every day | ORAL | Status: DC

## 2011-06-01 NOTE — MAU Note (Signed)
Pt states here for low hbg level, per MD office told to come to MAU, may need blood. Pt states she feels fatigued, can feel heart beating harder at times, has low back pain that rates 7/10 x2 weeks. Scant bleeding at present, however last week was going through 2 large pads at night.

## 2011-06-01 NOTE — MAU Provider Note (Signed)
Attestation of Attending Supervision of Advanced Practitioner: Evaluation and management procedures were performed by the [facility] Fellow/PA/CNM/NP under my supervision and collaboration. Chart reviewed, and agree with management and plan.  Jaynie Collins, M.D. 06/01/2011 9:33 PM

## 2011-06-01 NOTE — MAU Provider Note (Signed)
History     CSN: [id number]  Arrival date and time: 06/01/11 1433   None     Chief Complaint  Patient presents with  . Vaginal Bleeding  . Back Pain   HPI  Pt states that she has been bleeding for one month.  She sees an MD on [employer] that is a FP/?IM and he sent her to [facility] because of drop in Hemoglobin to 8.8  Past Medical History  Diagnosis Date  . Complication of anesthesia   . Spinal headache   . Anemia   . Hypertension   . Diabetes mellitus     Past Surgical History  Procedure Date  . Cesarean section   . Appendectomy     Family History  Problem Relation Age of Onset  . Anesthesia problems Neg Hx     History  Substance Use Topics  . Smoking status: Never Smoker   . Smokeless tobacco: Never Used  . Alcohol Use: No    Allergies: No Known Allergies  Prescriptions prior to admission  Medication Sig Dispense Refill  . lisinopril-hydrochlorothiazide (PRINZIDE,ZESTORETIC) 20-25 MG per tablet Take 1 tablet by mouth daily.      . metFORMIN (GLUCOPHAGE) 500 MG tablet Take 500 mg by mouth 2 (two) times daily with a meal.      . OVER THE COUNTER MEDICATION Take 1 tablet by mouth daily. Patient says she takes over the counter Iron      . vitamin C (ASCORBIC ACID) 500 MG tablet Take 500 mg by mouth daily.        Review of Systems  Neurological: Positive for dizziness and weakness.   Physical Exam   Blood pressure 130/83, pulse 97, temperature 98.5 F (36.9 C), temperature source Oral, resp. rate 16, height 5\' 1"  (1.549 m), weight 148 lb (67.132 kg), last menstrual period 05/06/2011, SpO2 100.00%.  Physical Exam  Vitals reviewed. Constitutional: She is oriented to person, place, and time. She appears well-developed and well-nourished.  HENT:  Head: Normocephalic.  Eyes: Pupils are equal, round, and reactive to light.  Neck: Normal range of motion. Neck supple.  Respiratory: Effort normal.  Musculoskeletal: Normal range of motion.    Neurological: She is alert and oriented to person, place, and time.  Skin: Skin is warm and dry.    MAU Course  Procedures Pt had to wait a long time in waiting room before room available.  Labs and ultrasound performed before exam- pt returned from ultrasound and was dressed and ready to leave Pelvic exam deferred Results for orders placed during the hospital encounter of 06/01/11 (from the past 24 hour(s))  CBC     Status: Abnormal   Collection Time   06/01/11  3:39 PM      Component Value Range   WBC 13.8 (*) 4.0 - 10.5 (K/uL)   RBC 4.51  3.87 - 5.11 (MIL/uL)   Hemoglobin 8.3 (*) 12.0 - 15.0 (g/dL)   HCT 78.2 (*) 95.6 - 46.0 (%)   MCV 65.2 (*) 78.0 - 100.0 (fL)   MCH 18.4 (*) 26.0 - 34.0 (pg)   MCHC 28.2 (*) 30.0 - 36.0 (g/dL)   RDW 21.3 (*) 08.6 - 15.5 (%)   Platelets 331  150 - 400 (K/uL)   Clinical Data: Menorrhagia  TRANSABDOMINAL AND TRANSVAGINAL ULTRASOUND OF PELVIS  Technique: Both transabdominal and transvaginal ultrasound  examinations of the pelvis were performed. Transabdominal technique  was performed for global imaging of the pelvis including uterus,  ovaries, adnexal  regions, and pelvic cul-de-sac.  Comparison: None.  It was necessary to proceed with endovaginal exam following the  transabdominal exam to visualize the ovaries and endometrium.  Findings:  Uterus: Measures 8.0 x 5.3 x 6.4 cm. There is an intrauterine  rounded echogenic mass measuring 3.2 x 3.6 x 3.6 cm. This appears  to be adjacent to the endometrium. It could be a submucosal  fibroid.  Endometrium: Normal in thickness measuring a maximum of 4.9 mm.  Right ovary: 4.6 x 4.4 x 5.0 cm solid appearing adnexal mass with  areas of acoustic shadowing. A dermoid cyst is possible.  Left ovary: Measures 3.0 x 2.2 x 2.1 cm. No cysts or masses.  Other findings: Trace free pelvic fluid.  IMPRESSION:  1. 3.2 x 3.6 x 3.6 cm intrauterine mass, likely the a submucosal  fibroid.  2. Normal appearance of the  endometrium.  3. 4.6 cm right adnexal mass possibly an ovarian dermoid.  4. Normal left ovary.  5. Recommend MRI pelvis without and with contrast for further  evaluation these findings.  Original Report Authenticated By: P. Loralie Champagne, M.D.            External Result Report     External Result Report      Discussed with Dr. Macon Large- will put pt on Provera 20mg  daily and have her f/u in the clinic for further evaluation Assessment and Plan  Abnormal uterine bleeding with anemia-provera 20mg  daily #30 no RF Submucosa fibroid per ultrasound Ovarian cyst ?dermoid F/u in GYN clinic in 2 to 3 weeks- GYN clinic to contact pt.  Andrea Mclaughlin 06/01/2011, 7:32 PM

## 2011-06-04 ENCOUNTER — Encounter: Payer: Self-pay | Admitting: *Deleted

## 2011-07-02 ENCOUNTER — Other Ambulatory Visit: Payer: Self-pay | Admitting: Obstetrics and Gynecology

## 2013-07-26 ENCOUNTER — Encounter (HOSPITAL_COMMUNITY): Payer: Self-pay | Admitting: Emergency Medicine

## 2013-07-26 ENCOUNTER — Emergency Department (HOSPITAL_COMMUNITY)
Admission: EM | Admit: 2013-07-26 | Discharge: 2013-07-26 | Disposition: A | Discharge status: Home / Self Care | Payer: BC Managed Care – PPO | Attending: Emergency Medicine | Admitting: Emergency Medicine

## 2013-07-26 DIAGNOSIS — E119 Type 2 diabetes mellitus without complications: Secondary | ICD-10-CM | POA: Insufficient documentation

## 2013-07-26 DIAGNOSIS — H539 Unspecified visual disturbance: Secondary | ICD-10-CM | POA: Insufficient documentation

## 2013-07-26 DIAGNOSIS — H53149 Visual discomfort, unspecified: Secondary | ICD-10-CM | POA: Insufficient documentation

## 2013-07-26 DIAGNOSIS — R519 Headache, unspecified: Secondary | ICD-10-CM

## 2013-07-26 DIAGNOSIS — Z862 Personal history of diseases of the blood and blood-forming organs and certain disorders involving the immune mechanism: Secondary | ICD-10-CM | POA: Insufficient documentation

## 2013-07-26 DIAGNOSIS — Z79899 Other long term (current) drug therapy: Secondary | ICD-10-CM | POA: Insufficient documentation

## 2013-07-26 DIAGNOSIS — I10 Essential (primary) hypertension: Secondary | ICD-10-CM | POA: Insufficient documentation

## 2013-07-26 DIAGNOSIS — R51 Headache: Secondary | ICD-10-CM | POA: Insufficient documentation

## 2013-07-26 DIAGNOSIS — R112 Nausea with vomiting, unspecified: Secondary | ICD-10-CM | POA: Insufficient documentation

## 2013-07-26 LAB — CBG MONITORING, ED: Glucose-Capillary: 131 mg/dL — ABNORMAL HIGH (ref 70–99)

## 2013-07-26 MED ORDER — METOCLOPRAMIDE HCL 5 MG/ML IJ SOLN
10.0000 mg | INTRAMUSCULAR | Status: AC
  Administered 2013-07-26: 10 mg via INTRAVENOUS
  Filled 2013-07-26: qty 2

## 2013-07-26 MED ORDER — KETOROLAC TROMETHAMINE 30 MG/ML IJ SOLN
30.0000 mg | Freq: Once | INTRAMUSCULAR | Status: AC
  Administered 2013-07-26: 30 mg via INTRAVENOUS
  Filled 2013-07-26: qty 1

## 2013-07-26 MED ORDER — SODIUM CHLORIDE 0.9 % IV BOLUS (SEPSIS)
1000.0000 mL | Freq: Once | INTRAVENOUS | Status: AC
  Administered 2013-07-26: 1000 mL via INTRAVENOUS

## 2013-07-26 MED ORDER — OXYCODONE-ACETAMINOPHEN 5-325 MG PO TABS
1.0000 | ORAL_TABLET | Freq: Once | ORAL | Status: AC
Start: 2013-07-26 — End: 2013-07-26
  Administered 2013-07-26: 1 via ORAL
  Filled 2013-07-26: qty 1

## 2013-07-26 MED ORDER — DIPHENHYDRAMINE HCL 50 MG/ML IJ SOLN
25.0000 mg | Freq: Once | INTRAMUSCULAR | Status: AC
  Administered 2013-07-26: 25 mg via INTRAVENOUS
  Filled 2013-07-26: qty 1

## 2013-07-26 MED ORDER — DEXAMETHASONE SODIUM PHOSPHATE 10 MG/ML IJ SOLN
10.0000 mg | Freq: Once | INTRAMUSCULAR | Status: AC
  Administered 2013-07-26: 10 mg via INTRAVENOUS
  Filled 2013-07-26: qty 1

## 2013-07-26 MED ORDER — ONDANSETRON 4 MG PO TBDP
8.0000 mg | ORAL_TABLET | Freq: Once | ORAL | Status: AC
  Administered 2013-07-26: 8 mg via ORAL
  Filled 2013-07-26: qty 2

## 2013-07-26 MED ORDER — LISINOPRIL-HYDROCHLOROTHIAZIDE 20-25 MG PO TABS: 1.0000 | ORAL_TABLET | Freq: Every day | ORAL | Status: DC

## 2013-07-26 NOTE — ED Notes (Signed)
Patient arrived in the room. EMT at the bedside hooking up patient to the monitor.

## 2013-07-26 NOTE — ED Notes (Signed)
The pt has had a headache since 1400 today with nausea.  lmp now.  She has a history of headaches which usually go along with her periods.  But this one is worse.  lmp now

## 2013-07-26 NOTE — ED Notes (Signed)
Joaquin Courts, PA at the bedside.

## 2013-07-26 NOTE — ED Provider Notes (Signed)
CSN: [id number]     Arrival date & time 07/26/13  1923 History   First MD Initiated Contact with Patient 07/26/13 2035     Chief Complaint  Patient presents with  . Headache     (Consider location/radiation/quality/duration/timing/severity/associated sxs/prior Treatment) HPI Comments: Patient is a 48 year old female with a history of hypertension, diabetes mellitus, and migraine headaches who presents to the emergency department today for a headache which began at 1400. Patient states that headache is throbbing in nature and present in her frontal region, radiating down her neck and back. Symptoms associated with nausea, intermittent blurred vision, photophobia. Patient endorses a history of similar headaches in the past associated with her menstrual cycle; she is menstruating now. She took ibuprofen for pain without relief. Patient denies associated fever, vision loss, tinnitus or hearing loss, chest pain, shortness of breath, abdominal pain, vomiting, numbness/paresthesias, and extremity weakness.  Patient is a 48 y.o. female presenting with headaches. The history is provided by the patient. The history is limited by a language barrier. A language interpreter was used (Daughter at bedside).  Headache Associated symptoms: nausea and photophobia   Associated symptoms: no fever, no neck stiffness, no numbness and no vomiting     Past Medical History  Diagnosis Date  . Complication of anesthesia   . Spinal headache   . Anemia   . Hypertension   . Diabetes mellitus    Past Surgical History  Procedure Laterality Date  . Cesarean section    . Appendectomy     Family History  Problem Relation Age of Onset  . Anesthesia problems Neg Hx    History  Substance Use Topics  . Smoking status: Never Smoker   . Smokeless tobacco: Never Used  . Alcohol Use: No   OB History   Grav Para Term Preterm Abortions TAB SAB Ect Mult Living   2 2 2       2      Review of Systems  Constitutional:  Negative for fever.  Eyes: Positive for photophobia and visual disturbance.  Respiratory: Negative for shortness of breath.   Cardiovascular: Negative for chest pain.  Gastrointestinal: Positive for nausea. Negative for vomiting.  Musculoskeletal: Negative for neck stiffness.  Neurological: Positive for headaches. Negative for syncope, weakness and numbness.  All other systems reviewed and are negative.     Allergies  Review of patient's allergies indicates no known allergies.  Home Medications   Prior to Admission medications   Medication Sig Start Date End Date Taking? Authorizing Provider  Acetaminophen (TYLENOL PO) Take 2 tablets by mouth daily as needed (for pain).   Yes Historical Provider, MD  ibuprofen (ADVIL,MOTRIN) 200 MG tablet Take 400 mg by mouth every 6 (six) hours as needed.   Yes Historical Provider, MD  lisinopril-hydrochlorothiazide (PRINZIDE,ZESTORETIC) 20-25 MG per tablet Take 1 tablet by mouth daily.   Yes Historical Provider, MD  metFORMIN (GLUCOPHAGE) 500 MG tablet Take 500 mg by mouth 2 (two) times daily with a meal.   Yes Historical Provider, MD  OVER THE COUNTER MEDICATION Take 1 tablet by mouth daily. Patient says she takes over the counter Iron   Yes Historical Provider, MD  vitamin C (ASCORBIC ACID) 500 MG tablet Take 500 mg by mouth daily.   Yes Historical Provider, MD   BP 137/73  Pulse 69  Temp(Src) 97.9 F (36.6 C) (Oral)  Resp 18  Ht 5\' 1"  (1.549 m)  Wt 147 lb 3 oz (66.764 kg)  BMI 27.83 kg/m2  SpO2  100%  LMP 07/26/2013  Physical Exam  Nursing note and vitals reviewed. Constitutional: She is oriented to person, place, and time. She appears well-developed and well-nourished. No distress.  Nontoxic/nonseptic appearing  HENT:  Head: Normocephalic and atraumatic.  Eyes: Conjunctivae and EOM are normal. No scleral icterus.  Neck: Normal range of motion. Neck supple.  No nuchal rigidity or meningismus  Cardiovascular: Normal rate, regular  rhythm and normal heart sounds.   Pulmonary/Chest: Effort normal and breath sounds normal. No respiratory distress. She has no wheezes. She has no rales.  Musculoskeletal: Normal range of motion.  Neurological: She is alert and oriented to person, place, and time.  GCS 15. Patient speaks in full goal oriented sentences. No cranial nerve deficits appreciated; symmetric eyebrow raise, no facial drooping, equal tongue protrusion. Patient has normal grip strength and strength against resistance bilaterally. No gross sensory deficits appreciated. DTRs normal and symmetric.   Skin: Skin is warm and dry. No rash noted. She is not diaphoretic. No erythema. No pallor.  Psychiatric: She has a normal mood and affect. Her behavior is normal.    ED Course  Procedures (including critical care time) Labs Review Labs Reviewed  CBG MONITORING, ED - Abnormal; Notable for the following:    Glucose-Capillary 131 (*)    All other components within normal limits    Imaging Review No results found.   EKG Interpretation None      MDM   Final diagnoses:  Headache, unspecified headache type    48 year old female with a history of headaches presents to the emergency department for headache associated with her menses. Patient endorses history of similar headaches in the past. Patient today without fever, nuchal rigidity, or meningismus. Her neurologic exam is nonfocal and symptoms improved over ED course with migraine cocktail. Doubt emergent intracranial process given history of similar headaches, lack of meningismus, and reassuring neurologic exam. Patient will be discharged with instruction followup with her primary care provider. Return precautions provided and patient agreeable to plan with no unaddressed concerns.   Filed Vitals:   07/26/13 2130 07/26/13 2145 07/26/13 2215 07/26/13 2230  BP: 127/76 123/67 126/69 137/73  Pulse: 73 70 62 69  Temp:      TempSrc:      Resp: 16 15 15 18   Height:       Weight:      SpO2: 99% 100% 100% 100%       Antonietta Breach, PA-C 07/26/13 2245

## 2013-07-26 NOTE — Discharge Instructions (Signed)
Recommend that you sleep in a quiet dark room for at least 8 hours for her headache relief. Followup with your primary doctor as needed. Take 600 mg ibuprofen every 6 hours as needed for persistent pain.  Dolor de Pensions consultant, preguntas frecuentes y sus respuestas (Headaches, Frequently Asked Questions) CEFALEAS MIGRAOSAS P: Qu es la migraa? Qu la ocasiona? Cmo puedo tratarla? R: En general, la migraa comienza como un dolor apagado. Luego progresa hacia un dolor, constante, punzante y como un latido. Sentir Copy las sienes. Podr sentir Aeronautical engineer parte anterior o posterior de la cabeza, o en uno o ambos lados. El dolor suele estar acompaado de una combinacin de:  Nuseas.  Vmitos.  Sensibilidad a la luz y los ruidos. Algunas personas (un 15%) experimentan un aura (ver abajo) antes de un ataque. La causa de la migraa se debe a reacciones qumicas del cerebro. El tratamiento para la migraa puede incluir medicamentos de [place]. Tambin puede incluir tcnicas de [place]. Estas incluyen entrenamientos para la relajacin y biorretroalimentacin.  P: Qu es un aura? R: Alrededor del 15% de las personas con migraa tiene un "aura". Es una seal de sntomas neurolgicos que ocurren antes de un dolor de cabeza por migraas. Podr ver lneas onduladas o irregulares, puntos o luces parpadeantes. Podr experimentar visin de tnel o puntos ciegos en uno o ambos ojos. El aura puede incluir alucinaciones visuales o auditivas (algo que se imagina). Puede incluir trastornos en el olfato (como olores extraos), el tacto o el gusto. Entre otros sntomas se incluyen:  Adormecimiento.  Sensacin de hormigueo.  Dificultad para recordar o Tax adviser. Estos episodios neurolgicos pueden durar hasta 60 minutos. Los sntomas desaparecern a medida que el dolor de cabeza comience. P:Qu es un disparador? R: Ciertos factores fsicos o Best boy a "disparar"  una migraa. Estos son:  Alimentos.  Cambios hormonales.  Clima.  Estrs. Es importante recordar que los disparadores son diferentes entre si. Para ayudar a prevenir ataques de migraas, necesitar descubrir cules son los Engineer, civil (consulting). Lleve un diario sobre sus dolores de [place]. Este es un buen modo para descubrir los disparadores. El Visual merchandiser en el momento de hablar con el profesional acerca de su enfermedad. P: El clima afecta en las migraas? R: La luz solar, el calor, la humedad y lo cambios drsticos en la presin Doctor, hospital a, o "disparar" un ataque de migraa en [employer]. Pero estudios han demostrado que el clima no acta como disparador para todas las personas con [place]. P: Cul es la relacin entre la migraa y la hormonas? R: Las hormonas inician y [place] funciones corporales. Las hormonas [employer] balance en el cuerpo dentro de los constantes cambios de [place]. Algunas veces, el nivel de hormonas en el cuerpo se desbalancea. Por ejemplo, durante la menstruacin, el embarazo o la [place]. Pueden ser la causa de un ataque de migraa. De hecho, alrededor de tres cuartos de las mujeres con migraa informan que sus ataques estn relacionados con el ciclo menstrual.  P: Aumenta el riesgo de sufrir un choque cardaco en las personas que padecen migraa? R: La probabilidad de que un ataque de migraa ocasione un ataque cardaco es muy remota. Esto no quiere Google persona que sufre de migraa no pueda tener un ataque cardaco asociado con ella. En las personas menores de 40 aos, el factor ms comn para un ataque es la [place]. [place]  vida de [place] persona, la ocurrencia de un dolor de cabeza por migraa est asociada con una reduccin en el riesgo de morir por un ataque cerebrovascular.  P: Cules son los medicamentos para la migraa? R: La medicacin precisa se [place] para tratar el dolor de cabeza  una vez que ha comenzado. Son ejemplos, medicamentos de [place], desinflamatorios sin esteroides, ergotamnicos y triptanos.  P: Qu son los triptanos? R: Lo triptanos son Rodena Piety clase de medicamentos abortivos. Son especficos para tratar este problema. Los triptanos son vasoconstrictores. Moderan algunas reacciones qumicas del cerebro. Los triptanos trabajan como receptores del cerebro. Ayudan a Doctor, general practice de un neurotransmisor denominado serotonina. Se cree que las fluctuaciones en los [place] de serotonina son la causa principal de la migraa.  P: Son [employer] de venta libre para la migraa? R: Los medicamentos de [employer] pueden ser efectivos para Public house manager dolores leves a moderados y los sntomas asociados a la [place]. Pero deber consultar a un mdico antes de Oncologist tratamiento para la migraa.  P: Cules son los medicamentos de prevencin de la migraa? R: Se suele denominar tratamiento "profilctico" a los medicamentos para la prevencin de la migraa. Se utilizan para reducir Scientist, forensic, gravedad y duracin de los ataques de [place]. Son ejemplos de medicamentos de prevencin: antiepilpticos, antidepresivos, bloqueadores beta, bloqueadores de los canales de calcio y medicamentos antiinflamatorios sin esteroides. P:  Por qu se utilizan anticonvulsivantes para tratar la migraa? R: Durante los ltimos aos, ha habido un creciente inters en las drogas antiepilpticas para la prevencin de la migraa. A menudo se los conoce como "anticonvulsivantes". La epilepsia y la migraa suceden por reacciones similares en el cerebro.  P:  Por qu se utilizan antidepresivos para tratar la migraa? R: Los antidepresivos tpicamente se [place] para tratar a las personas con depresin. Pueden reducir la frecuencia de la migraa a travs de la regulacin de los niveles qumicos, como la serotonina, en el cerebro.  P:  Por qu se utilizan terapias  alternativas para tratar la migraa? R: El trmino "terapias alternativas" suelen utilizarse para describir los tratamientos que se considera que estn por fuera de Doctor, hospital la medicina occidental convencional. Son ejemplos de las terapias alternativas: la acupuntura, la acupresin y el yoga. Otra terapia alternativa comn es la terapia herbal. Se cree que algunas hierbas ayudan a [employer] de [place]. Siempre consulte con [employer] acerca de las terapias alternativas antes de [place]. Algunos productos herbales contienen arsnico y [place] toxinas. DOLORES DE CABEZA POR TENSIN P: Qu es un dolor de cabeza por tensin? Qu lo ocasiona? Cmo puedo tratarlo? R: Los dolores de cabeza por tensin ocurren al azar. A menudo son el resultado de estrs temporario, ansiedad, fatiga o ira. Los sntomas Research scientist (physical sciences) en las sienes, una sensacin como de tener una banda alrededor de la cabeza (un dolor que "presiona"). Los sntomas pueden incluir una sensacin de [place], de presin y Writer de los msculos de la cabeza y el cuello. El dolor comienza en la frente, sienes o en la parte posterior de la cabeza y el cuello. El tratamiento para los dolores de cabeza por tensin puede incluir medicamentos de [place]. Tambin puede incluir tcnicas de [place] con entrenamientos para la relajacin y biorretroalimentacin. CEFALEA EN RACIMOS P: Qu es una cefalea en racimos? Qu la ocasiona? Cmo puedo tratarla? R: La cefalea en racimos toma su nombre debido a que los ataques vienen en grupos. El dolor aparece con poco o ningn  aviso. Normalmente ocurre de un lado de la cabeza. Muchas veces el dolor viene acompaado de un lagrimeo u ojo rojo y goteo de la nariz del mismo lado que Conservation officer, historic buildings. Se cree que la causa es una reaccin en las sustancias qumicas del cerebro. Se describe como el caso ms grave e intenso de cualquier tipo de dolor de cabeza. El tratamiento incluye medicamentos bajo receta y  oxgeno. CEFALEA SINUSAL P: Qu es una cefalea sinusal? Qu la ocasiona? Cmo puedo tratarla? R: Cuando se inflama una cavidad en los huesos de la cara y el crneo (sinus) ocasiona un dolor localizado. Esta enfermedad generalmente es el resultado de una reaccin alrgica, un tumor o una infeccin. Si el dolor de cabeza est ocasionado por un bloqueo del sinus, como una infeccin, probablemente tendr [place]. Una imagen de rayos X confirmar el bloqueo del sinus. El tratamiento indicado por el mdico podr incluir antibiticos para la infeccin, y tambin antihistamnicos o descongestivos.  DOLOR DE CABEZA POR EFECTO "REBOTE" P: Qu es un dolor de cabeza por efecto "rebote"? Qu lo ocasiona? Cmo puedo tratarlo? R: Si se toman medicamentos para el dolor de cabeza muy a menudo puede llevar a la enfermedad conocida como "dolor de cabeza por rebote". Un patrn de abuso de medicamentos para el dolor de cabeza supone tomarlos ms de [employer] por semana o en cantidades excesivas. Esto significa ms que lo que indica el envase o el mdico. Con los dolores de cabeza por rebote, los medicamentos no slo dejan de Best boy sino que adems comienzan a Electronics engineer dolores de [place]. Los mdicos tratan los dolores de cabeza por rebote mediante la disminucin del medicamento del que se ha abusado. A veces el medico podr sustituir gradualmente por un tipo diferente de tratamiento o medicacin. Dejar de consumirlo podra ser difcil. El abuso regular de un medicamento aumenta el potencial que se produzcan efectos secundarios graves. Consulte con un mdico si utiliza regularmente medicamentos para Conservation officer, historic buildings de cabeza ms de dos das por semana o ms de lo que indica el envase. PREGUNTAS Y RESPUESTAS ADICIONALES P: Qu es la biorretroalimentacin? R: La biorretroalimentacin es un tratamiento de [place]. La biorretroalimentacin utiliza un equipamiento especial para controlar los movimientos involuntarios del  cuerpo y las respuestas fsicas. La biorretroalimentacin controla:  Respiracin.  [place].  Latidos cardacos.  Temperatura.  Tensin muscular.  Actividad cerebrales. La biorretroalimentacin le ayudar a mejorar y Government social research officer sus ejercicios de Systems developer. Aprender a Chief Technology Officer las respuestas fsicas relacionadas con el estrs. Una vez que se dominan las tcnicas no necesitar ms el equipamiento. P: Son hereditarios los dolores de [place]? R: Segn algunas estimaciones, aproximadamente 28 millones de estadounidenses sufren migraa. Cuatro de cada cinco (80%) informan una historia familiar de migraa. Los investigadores no pueden asegurar si se trata de un problema gentico o Retail banker. A pesar de esto, un nio tiene 50% de probabilidades de sufrir migraa si uno de sus padres la sufre. El nio tiene un 75% de probabilidades si ambos padres la sufren.  P. Puede un nio tener migraa? R: En el momento de ingresar a la escuela secundaria, la mayora de los jvenes han experimentado algn tipo de cefalea. Algunos abordajes o medicamentos seguros y Location manager las cefaleas o detenerlas luego de que han comenzado.  Marita Snellen tipo de especialista debe ver para diagnosticar y tratar una cefalea? R: Comience con su mdico de cabecera. Luther Hearing de su experiencia y abordaje de las cefaleas. Comente los mtodos de  clasificacin, diagnstico y [place]. El profesional decidir si lo derivar a Teaching laboratory technician, segn los sntomas u otras enfermedades. El hecho de sufrir diabetes, Set designer, Social research officer, government, puede requerir un abordaje ms complejo. [place] ([place] para las [place]) Geneticist, molecular, a pedido, Furniture conservator/restorer lista de los mdicos que son miembros de [place]. Document Released: 12/29/2007 Document Revised: 04/09/2011 [facility] Patient Information 2015 [place]. This information is not intended to replace advice given to you by your health  care provider. Make sure you discuss any questions you have with your health care provider.

## 2013-07-26 NOTE — ED Notes (Signed)
She is supposed to take bp meds but she has not been taking it for several weeks or months

## 2013-07-26 NOTE — ED Provider Notes (Signed)
Medical screening examination/treatment/procedure(s) were performed by non-physician practitioner and as supervising physician I was immediately available for consultation/collaboration.   EKG Interpretation None        Malvin Johns, MD 07/26/13 [phone]

## 2013-09-11 NOTE — H&P (Signed)
  Patient name  Andrea Mclaughlin, Andrea Mclaughlin DICTATION#  [id number] CSN# [id number]  Darlyn Chamber, MD 09/11/2013 9:43 AM

## 2013-09-14 ENCOUNTER — Encounter (HOSPITAL_COMMUNITY): Payer: Self-pay

## 2013-09-14 ENCOUNTER — Encounter (HOSPITAL_COMMUNITY)
Admission: RE | Admit: 2013-09-14 | Discharge: 2013-09-14 | Disposition: A | Discharge status: Home / Self Care | Payer: BC Managed Care – PPO | Source: Ambulatory Visit | Attending: Obstetrics and Gynecology | Admitting: Obstetrics and Gynecology

## 2013-09-14 DIAGNOSIS — E119 Type 2 diabetes mellitus without complications: Secondary | ICD-10-CM | POA: Diagnosis not present

## 2013-09-14 DIAGNOSIS — N920 Excessive and frequent menstruation with regular cycle: Secondary | ICD-10-CM | POA: Diagnosis present

## 2013-09-14 DIAGNOSIS — I10 Essential (primary) hypertension: Secondary | ICD-10-CM | POA: Diagnosis not present

## 2013-09-14 DIAGNOSIS — D259 Leiomyoma of uterus, unspecified: Secondary | ICD-10-CM | POA: Diagnosis not present

## 2013-09-14 HISTORY — DX: Headache: R51

## 2013-09-14 HISTORY — DX: Headache, unspecified: R51.9

## 2013-09-14 LAB — COMPREHENSIVE METABOLIC PANEL
ALK PHOS: 64 U/L (ref 39–117)
ALT: 11 U/L (ref 0–35)
ANION GAP: 12 (ref 5–15)
AST: 15 U/L (ref 0–37)
Albumin: 3.7 g/dL (ref 3.5–5.2)
BUN: 13 mg/dL (ref 6–23)
CO2: 23 meq/L (ref 19–32)
Calcium: 9.2 mg/dL (ref 8.4–10.5)
Chloride: 101 mEq/L (ref 96–112)
Creatinine, Ser: 0.6 mg/dL (ref 0.50–1.10)
GLUCOSE: 137 mg/dL — AB (ref 70–99)
Potassium: 3.8 mEq/L (ref 3.7–5.3)
SODIUM: 136 meq/L — AB (ref 137–147)
Total Bilirubin: 0.3 mg/dL (ref 0.3–1.2)
Total Protein: 7.1 g/dL (ref 6.0–8.3)

## 2013-09-14 LAB — CBC
HEMATOCRIT: 32.9 % — AB (ref 36.0–46.0)
HEMOGLOBIN: 10.2 g/dL — AB (ref 12.0–15.0)
MCH: 22.5 pg — ABNORMAL LOW (ref 26.0–34.0)
MCHC: 31 g/dL (ref 30.0–36.0)
MCV: 72.6 fL — ABNORMAL LOW (ref 78.0–100.0)
Platelets: 293 10*3/uL (ref 150–400)
RBC: 4.53 MIL/uL (ref 3.87–5.11)
RDW: 15.5 % (ref 11.5–15.5)
WBC: 8.5 10*3/uL (ref 4.0–10.5)

## 2013-09-14 NOTE — H&P (Signed)
NAMESAVVY, PEETERS         ACCOUNT NO.:  [id number]  MEDICAL RECORD NO.:  [id number]  LOCATION:                                FACILITY:  [facility]  PHYSICIAN:  Darlyn Chamber, M.D.   DATE OF BIRTH:  March 15, 1965  DATE OF ADMISSION:  09/14/2013 DATE OF DISCHARGE:                             HISTORY & PHYSICAL   HISTORY OF PRESENT ILLNESS:  The patient is a 48 year old gravida 2, para 2 female, who was seen initially on April 8th for episodes of menorrhagia.  Cycles were relatively heavy and prolonged.  We subsequently did a ultrasound evaluation of the uterus.  She had a 6 x 6 cm posterior wall fibroid.  It deformed the uterine cavity.  The intrauterine cavity was not that remarkable.  An endometrial sampling was not performed at that time.  So, she did basically had 1 large fibroid causing the bleeding and we discussed options including hormonal suppression of her periods, radiological embolization, ablation, myomectomy, or hysterectomy.  The patient decided to proceed with the hydrothermal ablation.  Of note, she had her tube tied at the time of her last cesarean section.  ALLERGIES:  She has no known drug allergies.  MEDICATIONS:  None.  PAST MEDICAL HISTORY:  Usual childhood disease without any significant sequelae.  She has had 2 prior cesarean sections one with bilateral tubal ligation.  FAMILY HISTORY:  Noncontributory.  SOCIAL HISTORY:  Revealed no alcohol or tobacco use.  REVIEW OF SYSTEMS:  Noncontributory.  PHYSICAL EXAMINATION:  VITAL SIGNS:  The patient was afebrile with stable vital signs. HEENT:  The patient is normocephalic.  Pupils equal, round, and reactive to light and accommodation.  Extraocular movements were intact.  Sclerae were clear.  Oropharynx clear. NECK:  Without thyromegaly. BREASTS:  Not examined. LUNGS:  Clear. CARDIOVASCULAR:  Regular rate.  No murmurs or gallops.  No carotid or abdominal bruits. ABDOMEN:  Benign. PELVIC:  Normal  external genitalia.  Vaginal mucosa is clear.  Cervix unremarkable.  Uterus normal size, slightly regular, known uterine fibroids.  Adnexa unremarkable. EXTREMITIES:  Trace edema. NEUROLOGIC:  Grossly within normal limits.  IMPRESSION: 1. Menorrhagia secondary to uterine fibroids. 2. Diabetes. 3. Hypertension.  PLAN:  The patient to undergo radiological embolization.  The nature of procedure have been discussed.  Discussed this is not 100% successful, probably success rates would be 80%.  Risks discussed including the risk of infection.  The risk of hemorrhage that could require transfusion with the risk of age, hepatitis, excessive bleeding could require hysterectomy.  Risk of perforation with injury to adjacent organs including bowel that could require further exploratory surgery.  Risk of deep venous thrombosis and pulmonary embolus.  The patient expressed understanding indications of risk and alternatives.     Darlyn Chamber, M.D.     JSM/MEDQ  D:  09/11/2013  T:  09/11/2013  Job:  [id number]

## 2013-09-14 NOTE — Pre-Procedure Instructions (Signed)
PAT appt and assessment done with [facility]), interpreter, present.

## 2013-09-14 NOTE — Patient Instructions (Addendum)
Your procedure is scheduled on:09/16/13  Enter through the Main Entrance at : 53 am Pick up desk phone and dial [phone] and inform us of your arrival.  Please call [phone] if you have any problems the morning of surgery.  Remember: Do not eat food after midnight:Tuesday Clear liquids are ok until: 0830 am on Wed   You may brush your teeth the morning of surgery.   Do not take Metformin for 24 hours before surgery.  DO NOT wear jewelry, eye make-up, lipstick,body lotion, or dark fingernail polish.  (Polished toes are ok) You may wear deodorant.  If you are to be admitted after surgery, leave suitcase in car until your room has been assigned. Patients discharged on the day of surgery will not be allowed to drive home. Wear loose fitting, comfortable clothes for your ride home.

## 2013-09-16 ENCOUNTER — Encounter (HOSPITAL_COMMUNITY)
Admission: RE | Disposition: A | Discharge status: Home / Self Care | Payer: Self-pay | Source: Ambulatory Visit | Attending: Obstetrics and Gynecology

## 2013-09-16 ENCOUNTER — Ambulatory Visit (HOSPITAL_COMMUNITY): Payer: BC Managed Care – PPO | Admitting: Anesthesiology

## 2013-09-16 ENCOUNTER — Encounter (HOSPITAL_COMMUNITY): Payer: Self-pay | Admitting: Anesthesiology

## 2013-09-16 ENCOUNTER — Ambulatory Visit (HOSPITAL_COMMUNITY)
Admission: RE | Admit: 2013-09-16 | Discharge: 2013-09-16 | Disposition: A | Discharge status: Home / Self Care | Payer: BC Managed Care – PPO | Source: Ambulatory Visit | Attending: Obstetrics and Gynecology | Admitting: Obstetrics and Gynecology

## 2013-09-16 ENCOUNTER — Encounter (HOSPITAL_COMMUNITY): Payer: BC Managed Care – PPO | Admitting: Anesthesiology

## 2013-09-16 DIAGNOSIS — E119 Type 2 diabetes mellitus without complications: Secondary | ICD-10-CM | POA: Insufficient documentation

## 2013-09-16 DIAGNOSIS — N920 Excessive and frequent menstruation with regular cycle: Secondary | ICD-10-CM | POA: Insufficient documentation

## 2013-09-16 DIAGNOSIS — D259 Leiomyoma of uterus, unspecified: Secondary | ICD-10-CM | POA: Insufficient documentation

## 2013-09-16 DIAGNOSIS — I10 Essential (primary) hypertension: Secondary | ICD-10-CM | POA: Insufficient documentation

## 2013-09-16 HISTORY — PX: DILITATION & CURRETTAGE/HYSTROSCOPY WITH THERMACHOICE ABLATION: SHX5569

## 2013-09-16 HISTORY — PX: DILITATION & CURRETTAGE/HYSTROSCOPY WITH HYDROTHERMAL ABLATION: SHX5570

## 2013-09-16 LAB — HCG, SERUM, QUALITATIVE: Preg, Serum: NEGATIVE

## 2013-09-16 LAB — GLUCOSE, CAPILLARY: Glucose-Capillary: 130 mg/dL — ABNORMAL HIGH (ref 70–99)

## 2013-09-16 SURGERY — DILATATION & CURETTAGE/HYSTEROSCOPY WITH HYDROTHERMAL ABLATION
Anesthesia: General | Site: Uterus

## 2013-09-16 MED ORDER — FENTANYL CITRATE 0.05 MG/ML IJ SOLN
INTRAMUSCULAR | Status: AC
  Administered 2013-09-16: 50 ug via INTRAVENOUS
  Filled 2013-09-16: qty 2

## 2013-09-16 MED ORDER — EPHEDRINE 5 MG/ML INJ
INTRAVENOUS | Status: AC
  Filled 2013-09-16: qty 10

## 2013-09-16 MED ORDER — EPHEDRINE SULFATE 50 MG/ML IJ SOLN
INTRAMUSCULAR | Status: DC | PRN
  Administered 2013-09-16: 10 mg via INTRAVENOUS
  Administered 2013-09-16 (×2): 5 mg via INTRAVENOUS
  Administered 2013-09-16: 10 mg via INTRAVENOUS

## 2013-09-16 MED ORDER — LACTATED RINGERS IV SOLN
INTRAVENOUS | Status: DC
  Administered 2013-09-16 (×2): via INTRAVENOUS

## 2013-09-16 MED ORDER — SCOPOLAMINE 1 MG/3DAYS TD PT72
MEDICATED_PATCH | TRANSDERMAL | Status: AC
  Filled 2013-09-16: qty 1

## 2013-09-16 MED ORDER — FENTANYL CITRATE 0.05 MG/ML IJ SOLN
INTRAMUSCULAR | Status: AC
  Filled 2013-09-16: qty 5

## 2013-09-16 MED ORDER — OXYCODONE-ACETAMINOPHEN 7.5-325 MG PO TABS: 1.0000 | ORAL_TABLET | ORAL | Status: DC | PRN

## 2013-09-16 MED ORDER — CEFAZOLIN SODIUM-DEXTROSE 2-3 GM-% IV SOLR
2.0000 g | INTRAVENOUS | Status: AC
  Administered 2013-09-16: 2 g via INTRAVENOUS

## 2013-09-16 MED ORDER — PROPOFOL 10 MG/ML IV BOLUS
INTRAVENOUS | Status: DC | PRN
  Administered 2013-09-16: 170 mg via INTRAVENOUS

## 2013-09-16 MED ORDER — LIDOCAINE HCL (CARDIAC) 20 MG/ML IV SOLN
INTRAVENOUS | Status: DC | PRN
  Administered 2013-09-16: 80 mg via INTRAVENOUS

## 2013-09-16 MED ORDER — OXYCODONE-ACETAMINOPHEN 5-325 MG PO TABS
1.0000 | ORAL_TABLET | Freq: Once | ORAL | Status: AC
  Administered 2013-09-16: 1 via ORAL

## 2013-09-16 MED ORDER — SODIUM CHLORIDE 0.9 % IR SOLN
Status: DC | PRN
  Administered 2013-09-16: 1

## 2013-09-16 MED ORDER — MIDAZOLAM HCL 2 MG/2ML IJ SOLN
INTRAMUSCULAR | Status: DC | PRN
  Administered 2013-09-16: 1 mg via INTRAVENOUS

## 2013-09-16 MED ORDER — LIDOCAINE HCL (CARDIAC) 20 MG/ML IV SOLN
INTRAVENOUS | Status: AC
  Filled 2013-09-16: qty 5

## 2013-09-16 MED ORDER — SCOPOLAMINE 1 MG/3DAYS TD PT72
1.0000 | MEDICATED_PATCH | Freq: Once | TRANSDERMAL | Status: DC
  Administered 2013-09-16: 1.5 mg via TRANSDERMAL

## 2013-09-16 MED ORDER — ONDANSETRON HCL 4 MG/2ML IJ SOLN
INTRAMUSCULAR | Status: AC
  Filled 2013-09-16: qty 2

## 2013-09-16 MED ORDER — KETOROLAC TROMETHAMINE 30 MG/ML IJ SOLN
INTRAMUSCULAR | Status: DC | PRN
  Administered 2013-09-16: 30 mg via INTRAVENOUS

## 2013-09-16 MED ORDER — OXYCODONE-ACETAMINOPHEN 5-325 MG PO TABS
ORAL_TABLET | ORAL | Status: AC
  Filled 2013-09-16: qty 1

## 2013-09-16 MED ORDER — LIDOCAINE HCL 1 % IJ SOLN
INTRAMUSCULAR | Status: DC | PRN
  Administered 2013-09-16: 10 mL

## 2013-09-16 MED ORDER — KETOROLAC TROMETHAMINE 30 MG/ML IJ SOLN
INTRAMUSCULAR | Status: AC
  Filled 2013-09-16: qty 1

## 2013-09-16 MED ORDER — CEFAZOLIN SODIUM-DEXTROSE 2-3 GM-% IV SOLR
INTRAVENOUS | Status: AC
  Filled 2013-09-16: qty 50

## 2013-09-16 MED ORDER — ONDANSETRON HCL 4 MG/2ML IJ SOLN
INTRAMUSCULAR | Status: DC | PRN
  Administered 2013-09-16: 4 mg via INTRAVENOUS

## 2013-09-16 MED ORDER — LIDOCAINE HCL 1 % IJ SOLN
INTRAMUSCULAR | Status: AC
  Filled 2013-09-16: qty 20

## 2013-09-16 MED ORDER — MIDAZOLAM HCL 2 MG/2ML IJ SOLN
INTRAMUSCULAR | Status: AC
  Filled 2013-09-16: qty 2

## 2013-09-16 MED ORDER — FENTANYL CITRATE 0.05 MG/ML IJ SOLN
25.0000 ug | INTRAMUSCULAR | Status: DC | PRN
  Administered 2013-09-16 (×2): 50 ug via INTRAVENOUS

## 2013-09-16 MED ORDER — ACETAMINOPHEN 160 MG/5ML PO SOLN
ORAL | Status: AC
  Filled 2013-09-16: qty 20.3

## 2013-09-16 MED ORDER — ACETAMINOPHEN 160 MG/5ML PO SOLN
650.0000 mg | Freq: Once | ORAL | Status: AC
  Administered 2013-09-16: 650 mg via ORAL

## 2013-09-16 MED ORDER — FENTANYL CITRATE 0.05 MG/ML IJ SOLN
INTRAMUSCULAR | Status: DC | PRN
  Administered 2013-09-16 (×4): 50 ug via INTRAVENOUS

## 2013-09-16 MED ORDER — PROPOFOL 10 MG/ML IV EMUL
INTRAVENOUS | Status: AC
  Filled 2013-09-16: qty 20

## 2013-09-16 SURGICAL SUPPLY — 12 items
CATH ROBINSON RED A/P 16FR (CATHETERS) ×2 IMPLANT
CATH THERMACHOICE III (CATHETERS) ×2 IMPLANT
CLOTH BEACON ORANGE TIMEOUT ST (SAFETY) ×2 IMPLANT
CONTAINER PREFILL 10% NBF 60ML (FORM) ×4 IMPLANT
DRAPE HYSTEROSCOPY (DRAPE) ×2 IMPLANT
DRSG TELFA 3X8 NADH (GAUZE/BANDAGES/DRESSINGS) ×2 IMPLANT
GLOVE BIO SURGEON STRL SZ7 (GLOVE) ×2 IMPLANT
GOWN STRL REUS W/TWL LRG LVL3 (GOWN DISPOSABLE) ×4 IMPLANT
PACK VAGINAL MINOR WOMEN LF (CUSTOM PROCEDURE TRAY) ×2 IMPLANT
PAD OB MATERNITY 4.3X12.25 (PERSONAL CARE ITEMS) ×2 IMPLANT
SET GENESYS HTA PROCERVA (MISCELLANEOUS) ×2 IMPLANT
TOWEL OR 17X24 6PK STRL BLUE (TOWEL DISPOSABLE) ×4 IMPLANT

## 2013-09-16 NOTE — Progress Notes (Signed)
Patient's blood glucose was 125 at 1200.

## 2013-09-16 NOTE — Discharge Instructions (Signed)
Histeroscopa (Hysteroscopy) La histeroscopa es un procedimiento que se [place] para observar el interior de la matriz (tero) Puede indicarse por diferentes motivos, entre ellos:  Para evaluar una hemorragia anormal, un fibroma (tumor benigno, no canceroso), tumores, plipos o tejido cicatrizal Sport and exercise psychologist) y la posibilidad de cncer de tero.  Para detectar bultos (tumores) y otros crecimientos anormales uterinos.  Para buscar las causas por las que una mujer no queda embarazada (infertilidad) causas recurrentes de prdida de embarazo (abortos espontneos) o por la prdida de un dispositivo intrauterino (DIU).  Para realizar un procedimiento de esterilizacin cerrando las trompas de [place] desde adentro del tero. En este procedimiento, se coloca un tubo delgado y luminoso, con una cmara en el extremo (histeroscopio)para observar el interior del tero. La histeroscopa se realiza inmediatamente despus del perodo menstrual para asegurarse de que no existe embarazo. INFORME A SU MDICO:   Cualquier alergia que tenga.  Todos los [employer] C[place]il, incluyendo vitaminas, hierbas, gotas oftlmicas, cremas y medicamentos de venta libre.  Problemas previos que usted o los [employer] de su familia hayan tenido con el uso de anestsicos.  Enfermedades de [employer].  Cirugas previas.  Padecimientos mdicos. RIESGOS Y COMPLICACIONES  Generalmente es un procedimiento seguro. Sin embargo, Games developer procedimiento, pueden surgir complicaciones. Las complicaciones posibles son:  Perforacin del tero.  Sangrado excesivo.  Infeccin.  Lesin en el cuello del tero.  Lesiones en otros rganos.  Reaccin alrgica a un medicamento.  Inoculacin de lquido en exceso dentro del tero. ANTES DEL PROCEDIMIENTO   Consulte a su mdico si debe cambiar o suspender los medicamentos que toma habitualmente.  No tome aspirina ni anticoagulantes durante la semana previa al  procedimiento, o segn le hayan indicado. Pueden ocasionar hemorragias.  Si fuma, no lo haga durante las [employer] al procedimiento.  En algunos casos, el da anterior al procedimiento se coloca un medicamento en el cuello del tero. Este medicamento dilata el cuello y [place] la abertura. Esto facilita la insercin del instrumento en el tero durante el procedimiento.  No debe comer ni beber nada durante al menos 8 horas antes de la [place].  Pdale a alguna persona que la lleve a su casa luego del procedimiento. PROCEDIMIENTO   Le administrarn un medicamento para relajarse (sedante). Tambin podrn administrarle uno de los siguientes medicamentos:  Medicamentos que adormecen el rea del cuello uterino (anestesia local).  Un medicamento para que duerma durante el procedimiento (anestesia genera).  El histeroscopio se inserta a travs de la vagina, dentro del tero. La cmara del laparoscopio enva una imagen a una pantalla de televisin que se encuentra en el quirfano. [employer] modo, el mdico tendr Ardelia Mems buena visin del interior del tero.  Durante el Wellsite geologist un lquido o aire en el interior de [place], lo que le permitir al cirujano observar mejor.  En algunas ocasiones, el tejido del interior del tero se legra suavemente. Esas muestras de tejido se envan al laboratorio para ser examinadas. DESPUS DEL PROCEDIMIENTO   Si le han administrado anestesia general podr sentirse atontada durante algunas horas despus del procedimiento.  Si se utiliza Tour manager, podr volver a su casa tan pronto como est estable y se sienta en condiciones.  Podr sentir clicos leves. Es normal que esto dure un par [employer].  Podr tener una hemorragia, la que puede variar entre una pequea mancha durante algunos das hasta una hemorragia similar a Mining engineer durante 3-7 das. Esto es normal.  Si el  resultado de los estudios no estn listos durante la  visita, tenga otra entrevista con su mdico para conocerlos. Document Released: 01/15/2005 Document Revised: 11/05/2012 [facility] Patient Information 2015 [place], [place]. This information is not intended to replace advice given to you by your health care provider. Make sure you discuss any questions you have with your health care provider.

## 2013-09-16 NOTE — Brief Op Note (Signed)
09/16/2013  2:05 PM  PATIENT:  Andrea Mclaughlin  48 y.o. female  PRE-OPERATIVE DIAGNOSIS:  MENORRHAGIA  POST-OPERATIVE DIAGNOSIS:  MENORRHAGIA  PROCEDURE:  Procedure(s) with comments: DILATATION & CURETTAGE/HYSTEROSCOPY WITH HYDROTHERMAL ABLATION (N/A) - Failed HTA DILATATION & CURETTAGE/HYSTEROSCOPY WITH THERMACHOICE ABLATION (N/A)  SURGEON:  Surgeon(s) and Role:    * Darlyn Chamber, MD - Primary  PHYSICIAN ASSISTANT:   ASSISTANTS: none   ANESTHESIA:   general and paracervical block  EBL:  Total I/O In: 1000 [I.V.:1000] Out: 50 [Urine:50]  BLOOD ADMINISTERED:none  DRAINS: none   LOCAL MEDICATIONS USED:  XYLOCAINE   SPECIMEN:  Source of Specimen:  endometrial curetting  DISPOSITION OF SPECIMEN:  PATHOLOGY  COUNTS:  YES  TOURNIQUET:  * No tourniquets in log *  DICTATION: .Other Dictation: Dictation Number [id number]  PLAN OF CARE: Discharge to home after PACU  PATIENT DISPOSITION:  PACU - hemodynamically stable.   Delay start of Pharmacological VTE agent (>24hrs) due to surgical blood loss or risk of bleeding: not applicable

## 2013-09-16 NOTE — Anesthesia Postprocedure Evaluation (Signed)
  Anesthesia Post-op Note  Patient: Andrea Mclaughlin  Procedure(s) Performed: Procedure(s) with comments: DILATATION & CURETTAGE/HYSTEROSCOPY WITH HYDROTHERMAL ABLATION (N/A) - Failed HTA DILATATION & CURETTAGE/HYSTEROSCOPY WITH THERMACHOICE ABLATION (N/A)  Patient Location: PACU  Anesthesia Type:General  Level of Consciousness: awake, alert , oriented and patient cooperative  Airway and Oxygen Therapy: Patient Spontanous Breathing and Patient connected to nasal cannula oxygen  Post-op Pain: none  Post-op Assessment: Post-op Vital signs reviewed  Post-op Vital Signs: Reviewed and stable  Last Vitals:  Filed Vitals:   09/16/13 1130  BP: 112/74  Pulse: 77  Temp: 36.9 C  Resp: 18    Complications: No apparent anesthesia complications

## 2013-09-16 NOTE — Transfer of Care (Signed)
Immediate Anesthesia Transfer of Care Note  Patient: Andrea Mclaughlin  Procedure(s) Performed: Procedure(s) with comments: DILATATION & CURETTAGE/HYSTEROSCOPY WITH HYDROTHERMAL ABLATION (N/A) - Failed HTA DILATATION & CURETTAGE/HYSTEROSCOPY WITH THERMACHOICE ABLATION (N/A)  Patient Location: PACU  Anesthesia Type:General  Level of Consciousness: awake, alert , oriented and patient cooperative  Airway & Oxygen Therapy: Patient Spontanous Breathing and Patient connected to nasal cannula oxygen  Post-op Assessment: Report given to PACU RN and Post -op Vital signs reviewed and stable  Post vital signs: Reviewed and stable  Complications: No apparent anesthesia complications

## 2013-09-16 NOTE — Op Note (Signed)
Patient name  Andrea Mclaughlin, Andrea Mclaughlin DICTATION#  [id number] CSN# [id number]   Darlyn Chamber, MD 09/16/2013 2:06 PM

## 2013-09-16 NOTE — Anesthesia Preprocedure Evaluation (Signed)
Anesthesia Evaluation  Patient identified by MRN, date of birth, ID band Patient awake    Reviewed: Allergy & Precautions, H&P , Patient's Chart, lab work & pertinent test results, reviewed documented beta blocker date and time   Airway Mallampati: II TM Distance: >3 FB Neck ROM: full    Dental no notable dental hx.    Pulmonary  breath sounds clear to auscultation  Pulmonary exam normal       Cardiovascular hypertension, On Medications Rhythm:regular Rate:Normal     Neuro/Psych    GI/Hepatic   Endo/Other  diabetes  Renal/GU      Musculoskeletal   Abdominal   Peds  Hematology   Anesthesia Other Findings   Reproductive/Obstetrics                           Anesthesia Physical Anesthesia Plan  ASA: II  Anesthesia Plan:    Post-op Pain Management:    Induction: Intravenous  Airway Management Planned: LMA  Additional Equipment:   Intra-op Plan:   Post-operative Plan:   Informed Consent: I have reviewed the patients History and Physical, chart, labs and discussed the procedure including the risks, benefits and alternatives for the proposed anesthesia with the patient or authorized representative who has indicated his/her understanding and acceptance.   Dental Advisory Given and Dental advisory given  Plan Discussed with: CRNA and Surgeon  Anesthesia Plan Comments: (Discussed GA with LMA, possible sore throat, potential need to switch to ETT, N/V, pulmonary aspiration. Questions answered. )        Anesthesia Quick Evaluation

## 2013-09-16 NOTE — H&P (Signed)
  History and physical exam unchanged 

## 2013-09-17 ENCOUNTER — Encounter (HOSPITAL_COMMUNITY): Payer: Self-pay | Admitting: Obstetrics and Gynecology

## 2013-09-17 LAB — GLUCOSE, CAPILLARY: GLUCOSE-CAPILLARY: 125 mg/dL — AB (ref 70–99)

## 2013-09-17 NOTE — Op Note (Signed)
NAME:  Andrea Mclaughlin, Andrea Mclaughlin      ACCOUNT NO.:  [id number]  MEDICAL RECORD NO.:  [id number]  LOCATION:  [facility]                          FACILITY:  [facility]  PHYSICIAN:  Darlyn Chamber, M.D.   DATE OF BIRTH:  1965-05-17  DATE OF PROCEDURE:  09/16/2013 DATE OF DISCHARGE:  09/16/2013                              OPERATIVE REPORT   PREOPERATIVE DIAGNOSIS:  Menorrhagia secondary to uterine fibroids.  POSTOPERATIVE DIAGNOSIS:  Menorrhagia secondary to uterine fibroids.  OPERATIVE PROCEDURE:  Paracervical block, hysteroscopy, failed attempt at hydrothermal ablation, and failed attempt at ThermaChoice ablation.  SURGEON:  Darlyn Chamber, M.D.  ANESTHESIA:  Paracervical block along with general anesthesia.  ESTIMATED BLOOD LOSS:  Minimal.  PACKS AND DRAINS:  None.  INTRAOPERATIVE BLOOD REPLACEMENT:  None.  COMPLICATIONS:  As dictated.  DESCRIPTION OF PROCEDURE:  The patient was taken to the OR and placed in a supine position.  After satisfactory level of general anesthesia was obtained, she was placed in a dorsal supine position.  The perineum and vagina prepped out with Betadine.  Bladder was emptied by in-and-out catheterization.  The patient was draped in sterile field.  A speculum was placed in the vaginal vault.  The cervix was grasped with single- tooth tenaculum.  Uterus sounded to approximately 11-12 cm.  Cervix was serially dilated to a size 23 Pratt dilator.  The hydrothermal ablation hysteroscope was introduced into the endocervical cavity until we could see the endometrium and the uterine fundus.  We then proceeded with attempts at ablation, however, the machine kept saying we did not have a good seal.  We placed several tenaculums around the cervix to improve the seal, but never would provide a good enough seal.  I felt it may have been due to the size of the cavity.  So at this point in time, we removed the hydrothermal ablation hysteroscope.  When they had a hysteroscope  again using saline, visualization did not reveal any perforation.  Endometrium was otherwise unremarkable and smooth.  Of note, we had done endometrial curettings prior to this.  We then brought in the ThermaChoice.  It was properly primed, inserted in intrauterine cavity, drawn back slightly.  We began filling at 20 mL, we still could not get 150 mL pressure, so we put in another 10 mL, finally got to where the machine would turn on; however, I think because of the amount of fluid, the machine eventually failed, I do not think it got heated up completely due to the size of the cavity.  We went ahead and deflated the ThermaChoice balloon, we removed it intact.  At this point in time, speculum was removed.  There was no active bleeding or signs of complications, and we decided not to proceed with any further attempts at ablation.  I think both failed because the size of the uterine cavity. At this point in time, the patient was taken out of the dorsal lithotomy position, once alert and extubated, transferred to the recovery room in good condition.  Sponge, instrument, and needle count was reported as correct by circulating nurse x2.     Darlyn Chamber, M.D.     JSM/MEDQ  D:  09/16/2013  T:  09/16/2013  Job:  [id number]

## 2013-11-03 ENCOUNTER — Encounter (HOSPITAL_COMMUNITY): Payer: Self-pay

## 2013-11-03 ENCOUNTER — Encounter (HOSPITAL_COMMUNITY)
Admission: RE | Admit: 2013-11-03 | Discharge: 2013-11-03 | Disposition: A | Discharge status: Home / Self Care | Payer: BC Managed Care – PPO | Source: Ambulatory Visit | Attending: Obstetrics and Gynecology | Admitting: Obstetrics and Gynecology

## 2013-11-03 DIAGNOSIS — I10 Essential (primary) hypertension: Secondary | ICD-10-CM | POA: Diagnosis not present

## 2013-11-03 DIAGNOSIS — D649 Anemia, unspecified: Secondary | ICD-10-CM | POA: Diagnosis not present

## 2013-11-03 DIAGNOSIS — N920 Excessive and frequent menstruation with regular cycle: Secondary | ICD-10-CM | POA: Diagnosis not present

## 2013-11-03 DIAGNOSIS — E119 Type 2 diabetes mellitus without complications: Secondary | ICD-10-CM | POA: Insufficient documentation

## 2013-11-03 DIAGNOSIS — Z0000 Encounter for general adult medical examination without abnormal findings: Secondary | ICD-10-CM | POA: Diagnosis not present

## 2013-11-03 DIAGNOSIS — E781 Pure hyperglyceridemia: Secondary | ICD-10-CM | POA: Insufficient documentation

## 2013-11-03 DIAGNOSIS — D259 Leiomyoma of uterus, unspecified: Secondary | ICD-10-CM | POA: Diagnosis not present

## 2013-11-03 LAB — BASIC METABOLIC PANEL
Anion gap: 12 (ref 5–15)
BUN: 13 mg/dL (ref 6–23)
CHLORIDE: 99 meq/L (ref 96–112)
CO2: 27 mEq/L (ref 19–32)
Calcium: 9.6 mg/dL (ref 8.4–10.5)
Creatinine, Ser: 0.64 mg/dL (ref 0.50–1.10)
GFR calc Af Amer: >90 mL/min (ref >90)
Glucose, Bld: 143 mg/dL — ABNORMAL HIGH (ref 70–99)
Potassium: 4.2 mEq/L (ref 3.7–5.3)
Sodium: 138 mEq/L (ref 137–147)

## 2013-11-03 LAB — CBC
HEMATOCRIT: 33.2 % — AB (ref 36.0–46.0)
HEMOGLOBIN: 10.2 g/dL — AB (ref 12.0–15.0)
MCH: 21.7 pg — ABNORMAL LOW (ref 26.0–34.0)
MCHC: 30.7 g/dL (ref 30.0–36.0)
MCV: 70.5 fL — ABNORMAL LOW (ref 78.0–100.0)
Platelets: 245 10*3/uL (ref 150–400)
RBC: 4.71 MIL/uL (ref 3.87–5.11)
RDW: 16.1 % — ABNORMAL HIGH (ref 11.5–15.5)
WBC: 10.2 10*3/uL (ref 4.0–10.5)

## 2013-11-03 NOTE — Patient Instructions (Signed)
   Your procedure is scheduled on: Nov 09 2013 at [place] through the [place] [facility] at: Nov 09 2013 at [place] up the phone at the desk and dial [phone] and inform us of your arrival.  Please call this number if you have any problems the morning of surgery: [phone]  Remember: Do not eat food after midnight:oct 11 Do not drink clear liquids after: oct 11 Take these medicines the morning of surgery with a SIP OF WATER: do not take metformin day of surgery  Do not wear jewelry, make-up, or FINGER nail polish No metal in your hair or on your body. Do not wear lotions, powders, perfumes.  You may wear deodorant.  Do not bring valuables to the hospital. Contacts, dentures or bridgework may not be worn into surgery.  Leave suitcase in the car. After Surgery it may be brought to your room. For patients being admitted to the hospital, checkout time is 11:00am the day of discharge.    Patients discharged on the day of surgery will not be allowed to drive home.

## 2013-11-03 NOTE — Pre-Procedure Instructions (Signed)
Interpretor (eda royal) at pre-op visit

## 2013-11-04 NOTE — H&P (Signed)
  Patient name  Andrea Mclaughlin, Andrea Mclaughlin DICTATION#  [id number] CSN# [id number]  Darlyn Chamber, MD 11/04/2013 1:59 PM

## 2013-11-06 NOTE — H&P (Signed)
NAME:  Andrea Mclaughlin, Andrea Mclaughlin      ACCOUNT NO.:  [id number]  MEDICAL RECORD NO.:  [id number]  LOCATION:                                 FACILITY:  PHYSICIAN:  Darlyn Chamber, M.D.   DATE OF BIRTH:  1965/10/30  DATE OF ADMISSION:  11/09/2013 DATE OF DISCHARGE:                             HISTORY & PHYSICAL   The patient is a 48 year old, gravida 2, para 2 female, who presents for laparoscopic-assisted vaginal hysterectomy with removal of both fallopian tubes.  Cycles are relatively heavy and prolonged.  We did do an ultrasound evaluation.  She had a 6.6 cm posterior wall fibroid.  It did deform the uterine cavity.  She subsequently was taken to the hospital for outpatient attempts at hydrothermal ablation, which failed.  We also failed attempt at ThermaChoice ablation.  In view of the failed ablation and continued heavy cycles, she now presents for hysterectomy.  Other options have been discussed including radiological embolization versus hormonal suppression of her cycles.  ALLERGIES:  She has no known drug allergies.  MEDICATIONS:  None.  PAST MEDICAL HISTORY:  Usual childhood disease without any significant sequelae.  She has had 2 prior cesarean sections, one with bilateral tubal ligation.  FAMILY HISTORY:  Noncontributory.  SOCIAL:  No tobacco alcohol use.  REVIEW OF SYSTEMS:  Noncontributory.  PHYSICAL EXAMINATION:  VITAL SIGNS:  The patient is afebrile.  Stable vital signs. HEENT:  The patient is normocephalic.  Pupils equal, round, reactive to light and accommodation.  Extraocular movements were intact.  Sclerae and conjunctivae are clear.  Oropharynx  clear. NECK:  Without thyromegaly. BREASTS:  Not examined. LUNGS:  Clear. CARDIOVASCULAR:  Regular rhythm and rate without murmurs or gallops. ABDOMEN:  There was no carotid or abdominal bruits.  Her abdominal exam is benign.  Well-healed low-transverse incision. PELVIC:  Normal external genitalia.  Vaginal  mucosa is clear.  Uterus is enlarged approximately 9 weeks.  Adnexa are unremarkable. EXTREMITIES:  Minimal edema. NEUROLOGIC:  Grossly within normal limits.  IMPRESSION: 1. Menorrhagia with associated uterine fibroids.  Failed attempt at     ablation. 2. Diabetes. 3. Hypertension.  PLAN:  The patient will undergo laparoscopic-assisted vaginal hysterectomy with removal of both tubes.  The nature of the procedure have been discussed.  The risks have been explained.  This includes the risk of infection.  The risk of hemorrhage that could require transfusion with the risk of AIDS or hepatitis.  Risk of injury to adjacent organs including bladder, bowel, or ureters that could require further exploratory surgery.  Risk of deep venous thrombosis and pulmonary embolus.  The patient expressed understanding of the risk, indications, and possible complications.     Darlyn Chamber, M.D.     JSM/MEDQ  D:  11/04/2013  T:  11/04/2013  Job:  [id number]

## 2013-11-08 MED ORDER — LACTATED RINGERS IV SOLN
INTRAVENOUS | Status: DC
  Administered 2013-11-09 (×3): via INTRAVENOUS

## 2013-11-08 MED ORDER — SCOPOLAMINE 1 MG/3DAYS TD PT72
1.0000 | MEDICATED_PATCH | Freq: Once | TRANSDERMAL | Status: DC
  Administered 2013-11-09: 1.5 mg via TRANSDERMAL

## 2013-11-08 MED ORDER — CEFAZOLIN SODIUM-DEXTROSE 2-3 GM-% IV SOLR
2.0000 g | INTRAVENOUS | Status: AC
  Administered 2013-11-09: 2 g via INTRAVENOUS

## 2013-11-09 ENCOUNTER — Inpatient Hospital Stay (HOSPITAL_COMMUNITY)
Admission: RE | Admit: 2013-11-09 | Discharge: 2013-11-11 | DRG: 743 | Disposition: A | Discharge status: Home / Self Care | Payer: BC Managed Care – PPO | Source: Ambulatory Visit | Attending: Obstetrics and Gynecology | Admitting: Obstetrics and Gynecology

## 2013-11-09 ENCOUNTER — Encounter (HOSPITAL_COMMUNITY): Payer: Self-pay | Admitting: Certified Registered Nurse Anesthetist

## 2013-11-09 ENCOUNTER — Encounter (HOSPITAL_COMMUNITY)
Admission: RE | Disposition: A | Discharge status: Home / Self Care | Payer: Self-pay | Source: Ambulatory Visit | Attending: Obstetrics and Gynecology

## 2013-11-09 ENCOUNTER — Encounter (HOSPITAL_COMMUNITY): Payer: BC Managed Care – PPO | Admitting: Certified Registered Nurse Anesthetist

## 2013-11-09 ENCOUNTER — Ambulatory Visit (HOSPITAL_COMMUNITY): Payer: BC Managed Care – PPO | Admitting: Certified Registered Nurse Anesthetist

## 2013-11-09 DIAGNOSIS — Z90721 Acquired absence of ovaries, unilateral: Secondary | ICD-10-CM

## 2013-11-09 DIAGNOSIS — D270 Benign neoplasm of right ovary: Secondary | ICD-10-CM | POA: Diagnosis present

## 2013-11-09 DIAGNOSIS — I10 Essential (primary) hypertension: Secondary | ICD-10-CM | POA: Diagnosis present

## 2013-11-09 DIAGNOSIS — E119 Type 2 diabetes mellitus without complications: Secondary | ICD-10-CM | POA: Diagnosis present

## 2013-11-09 DIAGNOSIS — N920 Excessive and frequent menstruation with regular cycle: Secondary | ICD-10-CM | POA: Diagnosis present

## 2013-11-09 DIAGNOSIS — Z90710 Acquired absence of both cervix and uterus: Secondary | ICD-10-CM

## 2013-11-09 DIAGNOSIS — N736 Female pelvic peritoneal adhesions (postinfective): Secondary | ICD-10-CM | POA: Diagnosis present

## 2013-11-09 DIAGNOSIS — D251 Intramural leiomyoma of uterus: Secondary | ICD-10-CM | POA: Diagnosis present

## 2013-11-09 DIAGNOSIS — D252 Subserosal leiomyoma of uterus: Principal | ICD-10-CM | POA: Diagnosis present

## 2013-11-09 HISTORY — PX: LAPAROSCOPY: SHX197

## 2013-11-09 HISTORY — PX: OOPHORECTOMY: SHX6387

## 2013-11-09 HISTORY — PX: BILATERAL SALPINGECTOMY: SHX5743

## 2013-11-09 HISTORY — PX: ABDOMINAL HYSTERECTOMY: SHX81

## 2013-11-09 LAB — GLUCOSE, CAPILLARY
GLUCOSE-CAPILLARY: 255 mg/dL — AB (ref 70–99)
GLUCOSE-CAPILLARY: 202 mg/dL — AB (ref 70–99)
Glucose-Capillary: 172 mg/dL — ABNORMAL HIGH (ref 70–99)

## 2013-11-09 LAB — HCG, SERUM, QUALITATIVE: Preg, Serum: NEGATIVE

## 2013-11-09 SURGERY — LAPAROSCOPY, DIAGNOSTIC
Anesthesia: General | Site: Abdomen | Laterality: Right

## 2013-11-09 MED ORDER — FENTANYL CITRATE 0.05 MG/ML IJ SOLN
INTRAMUSCULAR | Status: DC | PRN
  Administered 2013-11-09 (×5): 50 ug via INTRAVENOUS

## 2013-11-09 MED ORDER — HYDROCHLOROTHIAZIDE 25 MG PO TABS
25.0000 mg | ORAL_TABLET | Freq: Every day | ORAL | Status: DC
  Administered 2013-11-09 – 2013-11-11 (×3): 25 mg via ORAL
  Filled 2013-11-09 (×3): qty 1

## 2013-11-09 MED ORDER — HYDROMORPHONE HCL 1 MG/ML IJ SOLN
INTRAMUSCULAR | Status: AC
  Filled 2013-11-09: qty 1

## 2013-11-09 MED ORDER — METFORMIN HCL 500 MG PO TABS
500.0000 mg | ORAL_TABLET | Freq: Two times a day (BID) | ORAL | Status: DC
  Administered 2013-11-09 – 2013-11-11 (×4): 500 mg via ORAL
  Filled 2013-11-09 (×4): qty 1

## 2013-11-09 MED ORDER — 0.9 % SODIUM CHLORIDE (POUR BTL) OPTIME
TOPICAL | Status: DC | PRN
  Administered 2013-11-09: 1000 mL

## 2013-11-09 MED ORDER — GLYCOPYRROLATE 0.2 MG/ML IJ SOLN
INTRAMUSCULAR | Status: DC | PRN
  Administered 2013-11-09: 0.4 mg via INTRAVENOUS

## 2013-11-09 MED ORDER — HYDROMORPHONE HCL 1 MG/ML IJ SOLN
INTRAMUSCULAR | Status: DC | PRN
  Administered 2013-11-09 (×4): 0.5 mg via INTRAVENOUS

## 2013-11-09 MED ORDER — ONDANSETRON HCL 4 MG/2ML IJ SOLN
INTRAMUSCULAR | Status: DC | PRN
  Administered 2013-11-09: 4 mg via INTRAVENOUS

## 2013-11-09 MED ORDER — ROCURONIUM BROMIDE 100 MG/10ML IV SOLN
INTRAVENOUS | Status: AC
  Filled 2013-11-09: qty 1

## 2013-11-09 MED ORDER — LISINOPRIL-HYDROCHLOROTHIAZIDE 20-25 MG PO TABS: 1.0000 | ORAL_TABLET | Freq: Every day | ORAL | Status: DC

## 2013-11-09 MED ORDER — DIPHENHYDRAMINE HCL 12.5 MG/5ML PO ELIX: 12.5000 mg | ORAL_SOLUTION | Freq: Four times a day (QID) | ORAL | Status: DC | PRN

## 2013-11-09 MED ORDER — KETOROLAC TROMETHAMINE 30 MG/ML IJ SOLN: 15.0000 mg | Freq: Once | INTRAMUSCULAR | Status: DC | PRN

## 2013-11-09 MED ORDER — KETOROLAC TROMETHAMINE 30 MG/ML IJ SOLN
INTRAMUSCULAR | Status: DC | PRN
  Administered 2013-11-09: 30 mg via INTRAVENOUS

## 2013-11-09 MED ORDER — HYDROMORPHONE 0.3 MG/ML IV SOLN
INTRAVENOUS | Status: DC
  Administered 2013-11-09: 11:00:00 via INTRAVENOUS
  Administered 2013-11-09: 1.19 mg via INTRAVENOUS
  Administered 2013-11-09: 2.18 mg via INTRAVENOUS
  Filled 2013-11-09: qty 25

## 2013-11-09 MED ORDER — GLYCOPYRROLATE 0.2 MG/ML IJ SOLN
INTRAMUSCULAR | Status: AC
  Filled 2013-11-09: qty 2

## 2013-11-09 MED ORDER — LIDOCAINE HCL (CARDIAC) 20 MG/ML IV SOLN
INTRAVENOUS | Status: DC | PRN
  Administered 2013-11-09: 60 mg via INTRAVENOUS

## 2013-11-09 MED ORDER — LISINOPRIL 20 MG PO TABS
20.0000 mg | ORAL_TABLET | Freq: Every day | ORAL | Status: DC
  Administered 2013-11-09 – 2013-11-11 (×3): 20 mg via ORAL
  Filled 2013-11-09 (×3): qty 1

## 2013-11-09 MED ORDER — BUPIVACAINE HCL (PF) 0.25 % IJ SOLN
INTRAMUSCULAR | Status: DC | PRN
  Administered 2013-11-09: 5 mL

## 2013-11-09 MED ORDER — MIDAZOLAM HCL 2 MG/2ML IJ SOLN
INTRAMUSCULAR | Status: DC | PRN
  Administered 2013-11-09: 2 mg via INTRAVENOUS

## 2013-11-09 MED ORDER — MIDAZOLAM HCL 2 MG/2ML IJ SOLN
INTRAMUSCULAR | Status: AC
  Filled 2013-11-09: qty 2

## 2013-11-09 MED ORDER — ONDANSETRON HCL 4 MG/2ML IJ SOLN: 4.0000 mg | Freq: Four times a day (QID) | INTRAMUSCULAR | Status: DC | PRN

## 2013-11-09 MED ORDER — PROPOFOL 10 MG/ML IV BOLUS
INTRAVENOUS | Status: DC | PRN
  Administered 2013-11-09: 120 mg via INTRAVENOUS

## 2013-11-09 MED ORDER — OXYCODONE-ACETAMINOPHEN 5-325 MG PO TABS
1.0000 | ORAL_TABLET | ORAL | Status: DC | PRN
  Administered 2013-11-09 (×2): 2 via ORAL
  Administered 2013-11-10: 1 via ORAL
  Administered 2013-11-10 (×3): 2 via ORAL
  Administered 2013-11-11: 1 via ORAL
  Filled 2013-11-09 (×5): qty 2
  Filled 2013-11-09: qty 1
  Filled 2013-11-09: qty 2
  Filled 2013-11-09: qty 1

## 2013-11-09 MED ORDER — IBUPROFEN 600 MG PO TABS
600.0000 mg | ORAL_TABLET | Freq: Four times a day (QID) | ORAL | Status: DC | PRN
  Administered 2013-11-09 – 2013-11-11 (×5): 600 mg via ORAL
  Filled 2013-11-09 (×5): qty 1

## 2013-11-09 MED ORDER — DEXAMETHASONE SODIUM PHOSPHATE 4 MG/ML IJ SOLN
INTRAMUSCULAR | Status: AC
  Filled 2013-11-09: qty 1

## 2013-11-09 MED ORDER — SODIUM CHLORIDE 0.9 % IJ SOLN: 9.0000 mL | INTRAMUSCULAR | Status: DC | PRN

## 2013-11-09 MED ORDER — DEXAMETHASONE SODIUM PHOSPHATE 10 MG/ML IJ SOLN
INTRAMUSCULAR | Status: DC | PRN
  Administered 2013-11-09: 4 mg via INTRAVENOUS

## 2013-11-09 MED ORDER — ONDANSETRON HCL 4 MG PO TABS: 4.0000 mg | ORAL_TABLET | Freq: Four times a day (QID) | ORAL | Status: DC | PRN

## 2013-11-09 MED ORDER — NEOSTIGMINE METHYLSULFATE 10 MG/10ML IV SOLN
INTRAVENOUS | Status: AC
  Filled 2013-11-09: qty 1

## 2013-11-09 MED ORDER — MEPERIDINE HCL 25 MG/ML IJ SOLN: 6.2500 mg | INTRAMUSCULAR | Status: DC | PRN

## 2013-11-09 MED ORDER — LACTATED RINGERS IV SOLN
INTRAVENOUS | Status: DC
  Administered 2013-11-10: 01:00:00 via INTRAVENOUS

## 2013-11-09 MED ORDER — NEOSTIGMINE METHYLSULFATE 10 MG/10ML IV SOLN
INTRAVENOUS | Status: DC | PRN
  Administered 2013-11-09: 3 mg via INTRAVENOUS

## 2013-11-09 MED ORDER — NALOXONE HCL 0.4 MG/ML IJ SOLN: 0.4000 mg | INTRAMUSCULAR | Status: DC | PRN

## 2013-11-09 MED ORDER — CEFAZOLIN SODIUM-DEXTROSE 2-3 GM-% IV SOLR
INTRAVENOUS | Status: AC
  Filled 2013-11-09: qty 50

## 2013-11-09 MED ORDER — FENTANYL CITRATE 0.05 MG/ML IJ SOLN
INTRAMUSCULAR | Status: AC
  Filled 2013-11-09: qty 5

## 2013-11-09 MED ORDER — HYDROMORPHONE HCL 1 MG/ML IJ SOLN
0.2500 mg | INTRAMUSCULAR | Status: DC | PRN
  Administered 2013-11-09 (×4): 0.5 mg via INTRAVENOUS

## 2013-11-09 MED ORDER — ONDANSETRON HCL 4 MG/2ML IJ SOLN
INTRAMUSCULAR | Status: AC
  Filled 2013-11-09: qty 2

## 2013-11-09 MED ORDER — PROMETHAZINE HCL 25 MG/ML IJ SOLN: 6.2500 mg | INTRAMUSCULAR | Status: DC | PRN

## 2013-11-09 MED ORDER — INFLUENZA VAC SPLIT QUAD 0.5 ML IM SUSY
0.5000 mL | PREFILLED_SYRINGE | INTRAMUSCULAR | Status: AC
  Administered 2013-11-10: 0.5 mL via INTRAMUSCULAR
  Filled 2013-11-09: qty 0.5

## 2013-11-09 MED ORDER — ROCURONIUM BROMIDE 100 MG/10ML IV SOLN
INTRAVENOUS | Status: DC | PRN
  Administered 2013-11-09: 40 mg via INTRAVENOUS
  Administered 2013-11-09 (×2): 10 mg via INTRAVENOUS
  Administered 2013-11-09: 20 mg via INTRAVENOUS

## 2013-11-09 MED ORDER — SCOPOLAMINE 1 MG/3DAYS TD PT72
MEDICATED_PATCH | TRANSDERMAL | Status: AC
  Administered 2013-11-09: 1.5 mg via TRANSDERMAL
  Filled 2013-11-09: qty 1

## 2013-11-09 MED ORDER — PROPOFOL 10 MG/ML IV EMUL
INTRAVENOUS | Status: AC
  Filled 2013-11-09: qty 20

## 2013-11-09 MED ORDER — ACETAMINOPHEN 325 MG PO TABS
650.0000 mg | ORAL_TABLET | ORAL | Status: DC | PRN
  Administered 2013-11-09: 650 mg via ORAL
  Filled 2013-11-09: qty 2

## 2013-11-09 MED ORDER — MENTHOL 3 MG MT LOZG: 1.0000 | LOZENGE | OROMUCOSAL | Status: DC | PRN

## 2013-11-09 MED ORDER — DIPHENHYDRAMINE HCL 50 MG/ML IJ SOLN: 12.5000 mg | Freq: Four times a day (QID) | INTRAMUSCULAR | Status: DC | PRN

## 2013-11-09 MED ORDER — LIDOCAINE HCL (CARDIAC) 20 MG/ML IV SOLN
INTRAVENOUS | Status: AC
  Filled 2013-11-09: qty 5

## 2013-11-09 MED ORDER — KETOROLAC TROMETHAMINE 30 MG/ML IJ SOLN
INTRAMUSCULAR | Status: AC
  Filled 2013-11-09: qty 1

## 2013-11-09 MED ORDER — BUPIVACAINE HCL (PF) 0.25 % IJ SOLN
INTRAMUSCULAR | Status: AC
  Filled 2013-11-09: qty 30

## 2013-11-09 SURGICAL SUPPLY — 53 items
BLADE SURG 10 STRL SS (BLADE) ×10 IMPLANT
BLADE SURG 11 STRL SS (BLADE) ×5 IMPLANT
CABLE HIGH FREQUENCY MONO STRZ (ELECTRODE) IMPLANT
CATH ROBINSON RED A/P 16FR (CATHETERS) ×5 IMPLANT
CLOTH BEACON ORANGE TIMEOUT ST (SAFETY) ×5 IMPLANT
CONT PATH 16OZ SNAP LID 3702 (MISCELLANEOUS) ×5 IMPLANT
COVER MAYO STAND STRL (DRAPES) ×5 IMPLANT
COVER TABLE BACK 60X90 (DRAPES) ×5 IMPLANT
DECANTER SPIKE VIAL GLASS SM (MISCELLANEOUS) ×5 IMPLANT
DERMABOND ADVANCED (GAUZE/BANDAGES/DRESSINGS) ×1
DERMABOND ADVANCED .7 DNX12 (GAUZE/BANDAGES/DRESSINGS) ×4 IMPLANT
DRSG COVADERM PLUS 2X2 (GAUZE/BANDAGES/DRESSINGS) IMPLANT
DRSG OPSITE POSTOP 3X4 (GAUZE/BANDAGES/DRESSINGS) ×5 IMPLANT
DRSG OPSITE POSTOP 4X10 (GAUZE/BANDAGES/DRESSINGS) ×5 IMPLANT
DURAPREP 26ML APPLICATOR (WOUND CARE) ×5 IMPLANT
ELECT REM PT RETURN 9FT ADLT (ELECTROSURGICAL) ×5
ELECTRODE REM PT RTRN 9FT ADLT (ELECTROSURGICAL) ×4 IMPLANT
EVACUATOR SMOKE 8.L (FILTER) IMPLANT
GLOVE BIO SURGEON STRL SZ7 (GLOVE) ×15 IMPLANT
GLOVE BIOGEL PI IND STRL 6.5 (GLOVE) ×4 IMPLANT
GLOVE BIOGEL PI IND STRL 7.0 (GLOVE) ×8 IMPLANT
GLOVE BIOGEL PI INDICATOR 6.5 (GLOVE) ×1
GLOVE BIOGEL PI INDICATOR 7.0 (GLOVE) ×2
GOWN STRL REUS W/ TWL LRG LVL3 (GOWN DISPOSABLE) ×28 IMPLANT
GOWN STRL REUS W/TWL LRG LVL3 (GOWN DISPOSABLE) ×7
NEEDLE INSUFFLATION 120MM (ENDOMECHANICALS) IMPLANT
NS IRRIG 1000ML POUR BTL (IV SOLUTION) ×5 IMPLANT
PACK LAVH (CUSTOM PROCEDURE TRAY) ×5 IMPLANT
PROTECTOR NERVE ULNAR (MISCELLANEOUS) ×5 IMPLANT
SEALER TISSUE G2 CVD JAW 45CM (ENDOMECHANICALS) IMPLANT
SET CYSTO W/LG BORE CLAMP LF (SET/KITS/TRAYS/PACK) IMPLANT
SET IRRIG TUBING LAPAROSCOPIC (IRRIGATION / IRRIGATOR) IMPLANT
SPONGE LAP 18X18 X RAY DECT (DISPOSABLE) ×10 IMPLANT
STRIP CLOSURE SKIN 1/4X3 (GAUZE/BANDAGES/DRESSINGS) IMPLANT
SUT MON AB 2-0 CT1 27 (SUTURE) ×10 IMPLANT
SUT PDS AB 0 CT 36 (SUTURE) ×5 IMPLANT
SUT VIC AB 0 CT1 18XCR BRD8 (SUTURE) ×16 IMPLANT
SUT VIC AB 0 CT1 27 (SUTURE) ×1
SUT VIC AB 0 CT1 27XBRD ANBCTR (SUTURE) ×4 IMPLANT
SUT VIC AB 0 CT1 36 (SUTURE) ×5 IMPLANT
SUT VIC AB 0 CT1 8-18 (SUTURE) ×4
SUT VIC AB 2-0 CT1 (SUTURE) ×5 IMPLANT
SUT VICRYL 0 TIES 12 18 (SUTURE) ×5 IMPLANT
SUT VICRYL 0 UR6 27IN ABS (SUTURE) ×5 IMPLANT
SUT VICRYL 1 TIES 12X18 (SUTURE) ×5 IMPLANT
SUT VICRYL 4-0 PS2 18IN ABS (SUTURE) ×5 IMPLANT
TOWEL OR 17X24 6PK STRL BLUE (TOWEL DISPOSABLE) ×15 IMPLANT
TRAY FOLEY CATH 14FR (SET/KITS/TRAYS/PACK) ×5 IMPLANT
TROCAR BALLN 12MMX100 BLUNT (TROCAR) ×5 IMPLANT
TROCAR OPTI TIP 5M 100M (ENDOMECHANICALS) ×5 IMPLANT
TROCAR XCEL DIL TIP R 11M (ENDOMECHANICALS) IMPLANT
WARMER LAPAROSCOPE (MISCELLANEOUS) ×5 IMPLANT
WATER STERILE IRR 1000ML POUR (IV SOLUTION) IMPLANT

## 2013-11-09 NOTE — Progress Notes (Signed)
Inpatient Diabetes Program Recommendations  AACE/ADA: New Consensus Statement on Inpatient Glycemic Control (2013)  Target Ranges:  Prepandial:   less than 140 mg/dL      Peak postprandial:   less than 180 mg/dL (1-2 hours)      Critically ill patients:  140 - 180 mg/dL   Results for SABRIEL, Andrea Mclaughlin (MRN [id number]) as of 11/09/2013 13:42  Ref. Range 11/09/2013 06:16 11/09/2013 09:39  Glucose-Capillary Latest Range: 70-99 mg/dL 172 (H) 255 (H)   Diabetes history: DM2 Outpatient Diabetes medications: Metformin 500 mg BID Current orders for Inpatient glycemic control: Metformin 500 mg BID  Inpatient Diabetes Program Recommendations Correction (SSI): While inpatient, please consider ordering CBGs with Novolog correction scale ACHS. HgbA1C: Please consider ordering an A1C to evaluate glycemic control over the past 2-3 months. Diet: When diet is advanced, please order Carb Modified Diabetic diet.  Thanks, Barnie Alderman, RN, MSN, CCRN Diabetes Coordinator Inpatient Diabetes Program [phone] (Team Pager) [phone] (AP office) [phone] [facility] office)

## 2013-11-09 NOTE — Progress Notes (Signed)
Patient ID: Andrea Mclaughlin, female   DOB: 1965/10/15, 48 y.o.   MRN: [id number] AF VSS ABD SOFT GOOD UO MINIMAL BLEEDIG

## 2013-11-09 NOTE — Anesthesia Postprocedure Evaluation (Signed)
Anesthesia Post Note  Patient: Andrea Mclaughlin  Procedure(s) Performed: Procedure(s) (LRB): LAPAROSCOPY DIAGNOSTIC (N/A) HYSTERECTOMY ABDOMINAL (N/A) BILATERAL SALPINGECTOMY (Bilateral) OOPHORECTOMY (Right)  Anesthesia type: General  Patient location: Women's Unit  Post pain: Pain level controlled  Post assessment: Post-op Vital signs reviewed  Last Vitals:  Filed Vitals:   11/09/13 1417  BP:   Pulse:   Temp:   Resp: 10    Post vital signs: Reviewed  Level of consciousness: sedated  Complications: No apparent anesthesia complications

## 2013-11-09 NOTE — Transfer of Care (Signed)
Immediate Anesthesia Transfer of Care Note  Patient: Andrea Mclaughlin  Procedure(s) Performed: Procedure(s): LAPAROSCOPY DIAGNOSTIC (N/A) HYSTERECTOMY ABDOMINAL (N/A) BILATERAL SALPINGECTOMY (Bilateral) OOPHORECTOMY (Right)  Patient Location: PACU  Anesthesia Type:General  Level of Consciousness: awake, alert , oriented and patient cooperative  Airway & Oxygen Therapy: Patient Spontanous Breathing and Patient connected to nasal cannula oxygen  Post-op Assessment: Report given to PACU RN and Post -op Vital signs reviewed and stable  Post vital signs: Reviewed and stable  Complications: No apparent anesthesia complications

## 2013-11-09 NOTE — Anesthesia Preprocedure Evaluation (Signed)
Anesthesia Evaluation  Patient identified by MRN, date of birth, ID band Patient awake    Reviewed: Allergy & Precautions, H&P , NPO status , Patient's Chart, lab work & pertinent test results  Airway Mallampati: II TM Distance: >3 FB Neck ROM: full    Dental no notable dental hx. (+) Teeth Intact   Pulmonary neg pulmonary ROS,          Cardiovascular hypertension, Pt. on medications     Neuro/Psych negative psych ROS   GI/Hepatic negative GI ROS, Neg liver ROS,   Endo/Other  negative endocrine ROSdiabetes  Renal/GU      Musculoskeletal   Abdominal Normal abdominal exam  (+)   Peds  Hematology   Anesthesia Other Findings   Reproductive/Obstetrics negative OB ROS                           Anesthesia Physical Anesthesia Plan  ASA: II  Anesthesia Plan: General   Post-op Pain Management:    Induction: Intravenous  Airway Management Planned: Oral ETT  Additional Equipment:   Intra-op Plan:   Post-operative Plan: Extubation in OR  Informed Consent: I have reviewed the patients History and Physical, chart, labs and discussed the procedure including the risks, benefits and alternatives for the proposed anesthesia with the patient or authorized representative who has indicated his/her understanding and acceptance.   Dental Advisory Given  Plan Discussed with: CRNA and Surgeon  Anesthesia Plan Comments:         Anesthesia Quick Evaluation

## 2013-11-09 NOTE — Addendum Note (Signed)
Addendum created 11/09/13 1715 by Asher Muir, CRNA   Modules edited: Notes Section   Notes Section:  File: [id number]

## 2013-11-09 NOTE — Op Note (Signed)
NAME:  Andrea Mclaughlin, Andrea Mclaughlin      ACCOUNT NO.:  [id number]  MEDICAL RECORD NO.:  [id number]  LOCATION:  [facility]                          [facility]  PHYSICIAN:  Darlyn Chamber, M.D.   DATE OF BIRTH:  Feb 01, 1965  DATE OF PROCEDURE:  11/09/2013 DATE OF DISCHARGE:                              OPERATIVE REPORT   PREOPERATIVE DIAGNOSIS:  Uterine fibroids with associated menorrhagia.  POSTOPERATIVE DIAGNOSES:  Uterine fibroids with associated menorrhagia with the addition of abdominopelvic adhesions and a right-sided dermoid tumor.  OPERATIVE PROCEDURE:  Open laparoscopy, exploratory laparotomy with lysis of adhesions, total abdominal hysterectomy with removal of both fallopian tubes, and removal of the right ovary.  SURGEON:  Darlyn Chamber, M.D.  ASSISTANT:  Morris.  ANESTHESIA:  General endotracheal.  ESTIMATED BLOOD LOSS:  400 mL.  PACKS:  None.  DRAINS:  Urethral Foley.  INTRAOPERATIVE BLOOD PLACED:  None.  COMPLICATIONS:  None.  INDICATION:  Dictated in history and physical.  PROCEDURE:  The patient was taken to the OR and placed in supine position.  After satisfactory level of general endotracheal anesthesia obtained, the perineum and vagina were prepped out with Betadine. Bladder was emptied by in and out catheterization.  The abdominal area was prepped with DuraPrep.  The patient was draped in sterile field. Subumbilical incision was made with knife, carried through subcutaneous tissue.  The fascia was entered sharply and incision was fashioned laterally.  Peritoneum was entered with blunt finger pressure.  Open laparoscopic trocar was put in place.  Abdomen was insufflated with carbon dioxide.  Laparoscope was introduced.  There were dense omental adhesions in the lower pelvic area.  We were able to get around as a 5- mm trocar was put in place suprapubic area.  We also had omental adhesions to the left adnexa.  Due to the extensive omental  adhesions, decision was to proceed with exploratory surgery.  The laparoscope was removed that was deflated with carbon dioxide.  Subumbilical fascia was closed with 2 figure-of-eights of 0 Vicryl.  Skin was closed with interrupted subcuticulars of 4-0 Vicryl.  She already had a midline and low-transverse incision.  We made another low-transverse incision extended through the subcutaneous tissue.  Anterior rectus fascia was entered sharply and incision of fascia was then laterally.  The fascia taken off the muscle superiorly and inferiorly using both blunt and sharp dissection.  Rectus muscles separated in midline.  Perineum was then entered.  Incision in the perineum extended both superiorly and inferiorly.  Some of the omental adhesions to the anterior peritoneum were taken down using the Bovie.  At this time, we had free space to get to the uterus.  A O'Connor-O'Sullivan retractor was put in place.  The uterus was then elevated.  Adnexa was clamped with long Kellys, we first went to the left side.  The left utero-ovarian pedicle was isolated, clamped, cut, and doubly ligated first with a free tie of 0 Vicryl and suture ligature of 0 Vicryl.  Next, the left round ligament was clamped, cut, and suture ligated with 0 Vicryl.  The bladder flap was then developed.  We went to the right side.  The ovary was cystically enlarged.  We first clamped, cut, and  tied the right round ligament.  We then developed the utero-ovarian pedicle, clamped, cut, and doubly ligated with free tie of 0 Vicryl and suture ligature of 0 Vicryl.  We entered the cyst of the ovary, turned out to be a dermoid.  We decided at this point time to remove the ovary.  The ovarian vasculature was isolated well above the ureter, clamped and cut and the ovarian vasculature was secured first with a free tie of 0 Vicryl and suture ligature of 0 Vicryl.  Again, the bladder flap was further developed. The uterine vessels on both  sides were skeletonized, clamped, cut, and tied with suture ligature of 0 Vicryl.  The peritoneum was serially separated from the size of the uterus using the clamp, cut, and tie technique with suture ligature of 0 Vicryl.  Vaginal angles on the left side was clamped and cut, similarly on the right side and entered the vaginal mucosa on each side.  The uterus was passed off the operative field.  Of note, we did remove the left fallopian tube.  The mesosalpinx was isolated, clamped, and cut.  The tube was passed off the operative field.  Held pedicles were secured with free tie of 0 Vicryl.  The right tube was similarly elevated.  The mesentery of the fallopian tube was then clamped, cut, and ligated with a free tie of 0 Vicryl, so both tubes were passed off the operative field also.  Vaginal angles were secured with suture ligature of 0 Vicryl.  The intervening vaginal mucosa was closed with interrupted sutures of 0 Vicryl.  The areas of oozing brought under control with the bipolar.  We had some oozing from the left adnexal, brought under control with a clamp and a free tie of 0 Vicryl.  The right side was hemostatically intact.  We identified the ureter was well below the area of surgery.  We irrigated the pelvis, had good hemostasis.  The 1 pack was removed along with the Fredia Sorrow retractor.  The perineum was closed with a running suture of 2-0 Vicryl.  Fascia was closed with a running suture of 0 PDS.  Fat was closed with a running suture of 0 plain catgut.  Skin was closed with a running subcuticular 4-0 Monocryl as well as Dermabond.  Of note, a Foley had been placed to straight drain.  Urine output remained clear and adequate.  Sponge, instrument, and needle count were reported as correct by circulating nurse x2.  The patient once extubated, transferred to recovery room in good condition.     Darlyn Chamber, M.D.     JSM/MEDQ  D:  11/09/2013  T:  11/09/2013   Job:  [id number]

## 2013-11-09 NOTE — H&P (Signed)
  History and physical exam unchanged 

## 2013-11-09 NOTE — Brief Op Note (Signed)
11/09/2013  9:25 AM  PATIENT:  Andrea Mclaughlin  48 y.o. female  PRE-OPERATIVE DIAGNOSIS:  MENORRHGIA,FIBROID  POST-OPERATIVE DIAGNOSIS:  MENORRHGIA,FIBROID  PROCEDURE:  Procedure(s): LAPAROSCOPY DIAGNOSTIC (N/A) HYSTERECTOMY ABDOMINAL (N/A) BILATERAL SALPINGECTOMY (Bilateral) OOPHORECTOMY (Right)  SURGEON:  Surgeon(s) and Role:    * Darlyn Chamber, MD - Primary    * Linda Hedges, DO - Assisting  PHYSICIAN ASSISTANT:   ASSISTANTS: morris    ANESTHESIA:   local and general  EBL:  Total I/O In: 1700 [I.V.:1700] Out: 450 [Urine:250; Blood:200]  BLOOD ADMINISTERED:none  DRAINS: Urinary Catheter (Foley)   LOCAL MEDICATIONS USED:  MARCAINE     SPECIMEN:  Source of Specimen:  uterus left ovary and both falloopian tubes  DISPOSITION OF SPECIMEN:  PATHOLOGY  COUNTS:  YES  TOURNIQUET:  * No tourniquets in log *  DICTATION: .Other Dictation: Dictation Number [id number]  PLAN OF CARE: Admit for overnight observation  PATIENT DISPOSITION:  PACU - hemodynamically stable.   Delay start of Pharmacological VTE agent (>24hrs) due to surgical blood loss or risk of bleeding: no

## 2013-11-09 NOTE — Progress Notes (Signed)
Notified Dr. Radene Knee that pt continues to complain of pain 8/10, despite being on Reduced-Dose PCA and receiving 650mg  tylenol. Order received for motrin. Pt received motrin, encouraged to eat, and will ambulate pt after that. Pt is reluctant, but encouraged her that ambulation may reduce soreness. Andrea Mclaughlin

## 2013-11-09 NOTE — Anesthesia Postprocedure Evaluation (Signed)
Anesthesia Post Note  Patient: Andrea Mclaughlin  Procedure(s) Performed: Procedure(s) (LRB): LAPAROSCOPY DIAGNOSTIC (N/A) HYSTERECTOMY ABDOMINAL (N/A) BILATERAL SALPINGECTOMY (Bilateral) OOPHORECTOMY (Right)  Anesthesia type: General  Patient location: PACU  Post pain: Pain level controlled  Post assessment: Post-op Vital signs reviewed  Last Vitals:  Filed Vitals:   11/09/13 0922  BP: 130/64  Pulse: 74  Temp: 36.9 C  Resp: 18    Post vital signs: Reviewed  Level of consciousness: sedated  Complications: No apparent anesthesia complications

## 2013-11-09 NOTE — Progress Notes (Signed)
I assisted Abby, RN with explanation and questions about pain level. [place] Interpreter.

## 2013-11-09 NOTE — Progress Notes (Signed)
I was with the patient at pre op and in the OR until she went to sleep. Andrea Mclaughlin Interpreter  I assisted Abby, RN with questions . [place] Interpreter.

## 2013-11-10 ENCOUNTER — Encounter (HOSPITAL_COMMUNITY): Payer: Self-pay | Admitting: Obstetrics and Gynecology

## 2013-11-10 DIAGNOSIS — D270 Benign neoplasm of right ovary: Secondary | ICD-10-CM | POA: Diagnosis present

## 2013-11-10 DIAGNOSIS — E119 Type 2 diabetes mellitus without complications: Secondary | ICD-10-CM | POA: Diagnosis present

## 2013-11-10 DIAGNOSIS — N736 Female pelvic peritoneal adhesions (postinfective): Secondary | ICD-10-CM | POA: Diagnosis present

## 2013-11-10 DIAGNOSIS — I10 Essential (primary) hypertension: Secondary | ICD-10-CM | POA: Diagnosis present

## 2013-11-10 DIAGNOSIS — D251 Intramural leiomyoma of uterus: Secondary | ICD-10-CM | POA: Diagnosis present

## 2013-11-10 DIAGNOSIS — D252 Subserosal leiomyoma of uterus: Secondary | ICD-10-CM | POA: Diagnosis present

## 2013-11-10 DIAGNOSIS — N920 Excessive and frequent menstruation with regular cycle: Secondary | ICD-10-CM | POA: Diagnosis present

## 2013-11-10 LAB — CBC
HCT: 24.2 % — ABNORMAL LOW (ref 36.0–46.0)
HCT: 25.1 % — ABNORMAL LOW (ref 36.0–46.0)
HEMOGLOBIN: 7.3 g/dL — AB (ref 12.0–15.0)
HEMOGLOBIN: 7.6 g/dL — AB (ref 12.0–15.0)
MCH: 21.4 pg — ABNORMAL LOW (ref 26.0–34.0)
MCH: 21.6 pg — AB (ref 26.0–34.0)
MCHC: 30.2 g/dL (ref 30.0–36.0)
MCHC: 30.3 g/dL (ref 30.0–36.0)
MCV: 71 fL — ABNORMAL LOW (ref 78.0–100.0)
MCV: 71.3 fL — ABNORMAL LOW (ref 78.0–100.0)
PLATELETS: 194 10*3/uL (ref 150–400)
Platelets: 194 10*3/uL (ref 150–400)
RBC: 3.41 MIL/uL — ABNORMAL LOW (ref 3.87–5.11)
RBC: 3.52 MIL/uL — AB (ref 3.87–5.11)
RDW: 17.3 % — ABNORMAL HIGH (ref 11.5–15.5)
RDW: 17.3 % — ABNORMAL HIGH (ref 11.5–15.5)
WBC: 9.4 10*3/uL (ref 4.0–10.5)
WBC: 8.7 10*3/uL (ref 4.0–10.5)

## 2013-11-10 LAB — GLUCOSE, CAPILLARY
GLUCOSE-CAPILLARY: 187 mg/dL — AB (ref 70–99)
GLUCOSE-CAPILLARY: 184 mg/dL — AB (ref 70–99)
GLUCOSE-CAPILLARY: 138 mg/dL — AB (ref 70–99)
Glucose-Capillary: 156 mg/dL — ABNORMAL HIGH (ref 70–99)

## 2013-11-10 NOTE — Progress Notes (Signed)
I stopped by to check on patient's needs.  Eda H Royal Interpreter. °

## 2013-11-10 NOTE — Progress Notes (Signed)
I assisted [place] [employer] with question about flu shot vaccine and pneumonia shot.By Juliann Mule [place] Interpreter

## 2013-11-10 NOTE — Progress Notes (Signed)
1 Day Post-Op Procedure(s) (LRB): LAPAROSCOPY DIAGNOSTIC (N/A) HYSTERECTOMY ABDOMINAL (N/A) BILATERAL SALPINGECTOMY (Bilateral) OOPHORECTOMY (Right)  Subjective: Patient reports incisional pain and no problems voiding.    Objective: I have reviewed patient's vital signs and labs.  General: alert GI: soft, non-tender; bowel sounds normal; no masses,  no organomegaly and incision: clean Vaginal Bleeding: minimal  Assessment: s/p Procedure(s): LAPAROSCOPY DIAGNOSTIC (N/A) HYSTERECTOMY ABDOMINAL (N/A) BILATERAL SALPINGECTOMY (Bilateral) OOPHORECTOMY (Right): stable  Plan: Advance diet  LOS: 1 day    Clebert Wenger S 11/10/2013, 9:08 AM

## 2013-11-11 LAB — GLUCOSE, CAPILLARY: GLUCOSE-CAPILLARY: 138 mg/dL — AB (ref 70–99)

## 2013-11-11 MED ORDER — OXYCODONE-ACETAMINOPHEN 7.5-325 MG PO TABS: 1.0000 | ORAL_TABLET | ORAL | Status: DC | PRN

## 2013-11-11 MED ORDER — PNEUMOCOCCAL VAC POLYVALENT 25 MCG/0.5ML IJ INJ: 0.5000 mL | INJECTION | INTRAMUSCULAR | Status: DC

## 2013-11-11 MED ORDER — PNEUMOCOCCAL VAC POLYVALENT 25 MCG/0.5ML IJ INJ
0.5000 mL | INJECTION | INTRAMUSCULAR | Status: AC
  Administered 2013-11-11: 0.5 mL via INTRAMUSCULAR
  Filled 2013-11-11: qty 0.5

## 2013-11-11 NOTE — Progress Notes (Signed)
Discharge teaching complete. Interpretor used for discharge teaching. Pt understood all information and did not have any questions. Pt pushed via wheelchair and discharged home to family.

## 2013-11-11 NOTE — Progress Notes (Signed)
2 Days Post-Op Procedure(s) (LRB): LAPAROSCOPY DIAGNOSTIC (N/A) HYSTERECTOMY ABDOMINAL (N/A) BILATERAL SALPINGECTOMY (Bilateral) OOPHORECTOMY (Right)  Subjective: Patient reports tolerating PO and no problems voiding.    Objective: I have reviewed patient's vital signs.  General: alert GI: soft, non-tender; bowel sounds normal; no masses,  no organomegaly Vaginal Bleeding: minimal  Assessment: s/p Procedure(s): LAPAROSCOPY DIAGNOSTIC (N/A) HYSTERECTOMY ABDOMINAL (N/A) BILATERAL SALPINGECTOMY (Bilateral) OOPHORECTOMY (Right): stable  Plan: Discharge home  LOS: 2 days    Andrea Mclaughlin S 11/11/2013, 9:30 AM

## 2013-11-11 NOTE — Progress Notes (Signed)
I stopped by patients room and check on her need , at time she only request her pain medicine, she said to me , she is been waiting for her medicine, I spoked with her nurse Burman Nieves and she patient received her medicine.By Juliann Mule, Interpreter

## 2013-11-11 NOTE — Discharge Summary (Signed)
  Patient name [name], [name] DICTATION#  [id number] CSN# [id number]  Darlyn Chamber, MD 11/11/2013 9:32 AM

## 2013-11-11 NOTE — Progress Notes (Signed)
Stopped by to check on patient's needs. [place] Interpreter.

## 2013-11-11 NOTE — Discharge Summary (Signed)
NAME:  Andrea Mclaughlin, Andrea Mclaughlin      ACCOUNT NO.:  [id number]  MEDICAL RECORD NO.:  [id number]  LOCATION:  9303                          [facility]ILITY:  [facility]  PHYSICIAN:  Darlyn Chamber, M.D.   DATE OF BIRTH:  [birth year]  DATE OF ADMISSION:  11/09/2013 DATE OF DISCHARGE:  11/11/2013                              DISCHARGE SUMMARY   ADMITTING DIAGNOSIS:  Symptomatic uterine fibroids.  DISCHARGE DIAGNOSIS:  Symptomatic uterine fibroids.  OPERATIVE PROCEDURE:  Open laparoscopy with subsequent exploratory laparotomy with total abdominal hysterectomy with removal of right ovary and removal of both fallopian tubes.  For complete history and physical, please see dictated note.  COURSE IN THE HOSPITAL:  By the time of laparoscopy, dense omental adhesions were noted.  We therefore did a low transverse incision and did a total abdominal hysterectomy.  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  Postoperatively, she did well.  We kept her during her first postop day, her hemoglobin initially was 7.3 and Followup was 7.6.  She is diabetic, on metformin.  Her blood sugars remained fairly good control.  She is discharged home on her second postop day, at that time, she was afebrile and with stable vital signs.  Her abdomen was soft. Bowel sounds were active.  She was passing flatus.  She was voiding without difficulty.  All incisions were clear.  In terms of complications, none were encountered during her stay in the hospital.  The patient was discharged home in stable condition.  DISPOSITION:  The patient to avoid heavy lifting, vaginal entrance, or driving a car.  She is to call should there be active vaginal bleeding. Any signs of infection should be reported.  Nausea and vomiting should be reported.  Excessive pain or discomfort.  Any redness or drainage from the incision.  Instructed of signs and symptoms of deep  venous thrombosis and pulmonary embolus.  Will be discharged on Percocet as she needs for pain and iron sulfate supplementation.  Plan to follow up in the office on Tuesday.     Darlyn Chamber, M.D.     JSM/MEDQ  D:  11/11/2013  T:  11/11/2013  Job:  [id number]

## 2013-11-30 ENCOUNTER — Encounter (HOSPITAL_COMMUNITY): Payer: Self-pay | Admitting: Obstetrics and Gynecology

## 2014-11-17 ENCOUNTER — Encounter: Payer: Self-pay | Admitting: Emergency Medicine

## 2015-06-29 ENCOUNTER — Encounter (HOSPITAL_COMMUNITY): Payer: Self-pay | Admitting: Emergency Medicine

## 2015-06-29 ENCOUNTER — Ambulatory Visit (HOSPITAL_COMMUNITY)
Admission: EM | Admit: 2015-06-29 | Discharge: 2015-06-29 | Disposition: A | Discharge status: Home / Self Care | Payer: BLUE CROSS/BLUE SHIELD | Attending: Emergency Medicine | Admitting: Emergency Medicine

## 2015-06-29 DIAGNOSIS — L03115 Cellulitis of right lower limb: Secondary | ICD-10-CM | POA: Diagnosis not present

## 2015-06-29 DIAGNOSIS — B353 Tinea pedis: Secondary | ICD-10-CM | POA: Diagnosis not present

## 2015-06-29 MED ORDER — CLOTRIMAZOLE 1 % EX CREA: TOPICAL_CREAM | CUTANEOUS | Status: DC

## 2015-06-29 MED ORDER — CEPHALEXIN 500 MG PO CAPS: 500.0000 mg | ORAL_CAPSULE | Freq: Two times a day (BID) | ORAL | Status: DC

## 2015-06-29 MED ORDER — TRAMADOL HCL 50 MG PO TABS: 50.0000 mg | ORAL_TABLET | Freq: Four times a day (QID) | ORAL | Status: DC | PRN

## 2015-06-29 NOTE — Discharge Instructions (Signed)
Celulitis (Cellulitis)  La celulitis es una infeccin de la piel y del tejido que se encuentra debajo de la piel. El rea infectada generalmente est de color rojo y duele. Ocurre con ms frecuencia en los brazos y en las piernas. [place] los antibiticos tal como se le indic. Finalice el [place], aunque comience a Sports administrator.  Mantenga el brazo o la pierna infectada elevada.  Aplique paos calientes en la zona hasta 4 veces al da.  Tome slo los medicamentos que le haya indicado el mdico.  [place]umpla con los controles mdicos segn las indicaciones. SOLICITE AYUDA SI:  Observa una lnea roja en la piel que sale desde la herida.  El rea roja se extiende o se vuelve de color oscuro.  El hueso o la articulacin que se encuentran por debajo de la zona infectada le duelen despus de que la piel se [place].  La infeccin se repite en la misma zona o en una zona diferente.  Tiene un bulto inflamado (hinchado) en la zona infectada.  Aparecen nuevos sntomas.  Tiene fiebre. SOLICITE AYUDA DE INMEDIATO SI:   Se siente muy somnoliento.  Vomita o tiene diarrea.  Se siente enfermo y tiene [employer].   Esta informacin no tiene Marine scientist el consejo del mdico. Asegrese de hacerle al mdico cualquier pregunta que tenga.   Document Released: 07/05/2009 Document Revised: 10/06/2014 Elsevier Interactive Patient Education 2016 [place] (Athlete's Foot) El pie de atleta es una infeccin de la piel causada por un hongo. Se observa comnmente entre o debajo de los dedos de los pies. Tambin puede aparecer en la planta del pie. Se contagia de una persona a otra al compartir toallas o superficies de duchas. CUIDADOS EN EL HOGAR  Slo tome medicamentos como lo indique su mdico. No utilice cremas con corticoides.  Lvese los pies [employer]. Squelos bien, [employer].  Cmbiese los calcetines [employer]. Use calcetines de algodn o lana.  Cambie sus calcetines 2  3 veces por da en poca de calor.  Use sandalias o zapatillas de lona que tengan buena ventilacin.  Si tiene ampollas, remoje sus pies en una solucin, segn las indicaciones de su mdico. Hgalo durante 20 a 30 minutos, 2 veces por Training and development officer. Seque sus pies despus de remojarlos.  No comparta toallas.  Use sandalias cuando comparta vestuarios o duchas. SOLICITE AYUDA DE INMEDIATO SI:   Tiene fiebre.  Su pie est inflamado (hinchado), le duele, est caliente o rojo.  No mejora luego de 7 das de tratamiento.  An sufre pie de atleta despus de 30 das.  Tiene problemas relacionados con los medicamentos. ASEGRESE DE QUE:   Comprende estas instrucciones.  Controlar su enfermedad.  Solicitar ayuda de inmediato si no mejora o si empeora.   Esta informacin no tiene Marine scientist el consejo del mdico. Asegrese de hacerle al mdico cualquier pregunta que tenga.   Document Released: 02/17/2010 Document Revised: 04/09/2011 Elsevier Interactive Patient Education [employer].

## 2015-06-29 NOTE — ED Notes (Signed)
Pt is diabetic. PT began itching between the right third and fourth toe four days ago. She scratched the area and now has a wound between these two toes and surrounding redness. PT reports severe pain over top of foot and up that shin

## 2015-06-29 NOTE — ED Provider Notes (Signed)
CSN: [id number]     Arrival date & time 06/29/15  1257 History   First MD Initiated Contact with Patient 06/29/15 1313     Chief Complaint  Patient presents with  . Foot Pain   (Consider location/radiation/quality/duration/timing/severity/associated sxs/prior Treatment) Patient is a 50 y.o. female presenting with lower extremity pain. The history is provided by the patient and a relative. The history is limited by a language barrier (pt speaks fairly good [place], agrees having daughter help with visit). Language interpreter used: Pt's daughter.  Foot Pain    The pt is a 50yo female presenting to [facility] with c/o, accompanied by her daughter, with c/o Right foot pain between 3rd and 4th toe after getting shoes wet in the rain 4 days ago. Pt is a Type 2 NIDDM, CBGs have been around 170s.  Pain is aching and sore, itching at times, mild to moderate, radiating up Right shin. Pain is worse with ambulation. She has tried ibuprofen with minimal relief.  Denies fever, n/v/d.   Past Medical History  Diagnosis Date  . Complication of anesthesia   . Anemia   . Hypertension   . Diabetes mellitus   . Aching headache   . Spinal headache    Past Surgical History  Procedure Laterality Date  . Cesarean section    . Appendectomy    . Dilitation & currettage/hystroscopy with hydrothermal ablation N/A 09/16/2013    Procedure: DILATATION & CURETTAGE/HYSTEROSCOPY WITH HYDROTHERMAL ABLATION;  Surgeon: Darlyn Chamber, MD;  Location: [facility];  Service: Gynecology;  Laterality: N/A;  Failed HTA  . Dilitation & currettage/hystroscopy with thermachoice ablation N/A 09/16/2013    Procedure: DILATATION & CURETTAGE/HYSTEROSCOPY WITH THERMACHOICE ABLATION;  Surgeon: Darlyn Chamber, MD;  Location: [facility];  Service: Gynecology;  Laterality: N/A;  . Laparoscopy N/A 11/09/2013    Procedure: LAPAROSCOPY DIAGNOSTIC;  Surgeon: Darlyn Chamber, MD;  Location: [facility];  Service: Gynecology;  Laterality: N/A;  . Abdominal hysterectomy  N/A 11/09/2013    Procedure: HYSTERECTOMY ABDOMINAL;  Surgeon: Darlyn Chamber, MD;  Location: [facility];  Service: Gynecology;  Laterality: N/A;  . Bilateral salpingectomy Bilateral 11/09/2013    Procedure: BILATERAL SALPINGECTOMY;  Surgeon: Darlyn Chamber, MD;  Location: [facility];  Service: Gynecology;  Laterality: Bilateral;  . Oophorectomy Right 11/09/2013    Procedure: OOPHORECTOMY;  Surgeon: Darlyn Chamber, MD;  Location: [facility];  Service: Gynecology;  Laterality: Right;   Family History  Problem Relation Age of Onset  . Anesthesia problems Neg Hx    Social History  Substance Use Topics  . Smoking status: Never Smoker   . Smokeless tobacco: Never Used  . Alcohol Use: No   OB History    Gravida Para Term Preterm AB TAB SAB Ectopic Multiple Living   2 2 2       2      Review of Systems  Constitutional: Negative for fever and chills.  Gastrointestinal: Negative for nausea and vomiting.  Musculoskeletal: Positive for arthralgias ( 3rd & 4th toes). Negative for myalgias and joint swelling.  Skin: Positive for color change, rash and wound. Negative for pallor.    Allergies  Review of patient's allergies indicates no known allergies.  Home Medications   Prior to Admission medications   Medication Sig Start Date End Date Taking? Authorizing Provider  metFORMIN (GLUCOPHAGE) 500 MG tablet Take 500 mg by mouth 2 (two) times daily with a meal.   Yes Historical Provider, MD  Acetaminophen (TYLENOL PO) Take 2 tablets  by mouth daily as needed (for pain).    Historical Provider, MD  cephALEXin (KEFLEX) 500 MG capsule Take 1 capsule (500 mg total) by mouth 2 (two) times daily. For 7 days 06/29/15   Noland Fordyce, PA-C  clotrimazole (LOTRIMIN) 1 % cream Apply to affected area 2 times daily 06/29/15   Noland Fordyce, PA-C  ibuprofen (ADVIL,MOTRIN) 200 MG tablet Take 400 mg by mouth every 6 (six) hours as needed.    Historical Provider, MD  lisinopril-hydrochlorothiazide (PRINZIDE,ZESTORETIC) 20-25 MG  per tablet Take 1 tablet by mouth daily.    Historical Provider, MD  OVER THE COUNTER MEDICATION Take 1 tablet by mouth daily. Patient says she takes over the counter Iron    Historical Provider, MD  oxyCODONE-acetaminophen (PERCOCET) 7.5-325 MG per tablet Take 1 tablet by mouth every 4 (four) hours as needed for pain. 09/16/13   Arvella Nigh, MD  oxyCODONE-acetaminophen (PERCOCET) 7.5-325 MG per tablet Take 1 tablet by mouth every 4 (four) hours as needed for pain. 11/11/13   Arvella Nigh, MD  traMADol (ULTRAM) 50 MG tablet Take 1 tablet (50 mg total) by mouth every 6 (six) hours as needed. 06/29/15   Noland Fordyce, PA-C  vitamin C (ASCORBIC ACID) 500 MG tablet Take 500 mg by mouth daily.    Historical Provider, MD   Meds Ordered and Administered this Visit  Medications - No data to display  BP 166/92 mmHg  Pulse 82  Temp(Src) 98 F (36.7 C) (Oral)  Resp 18  SpO2 98%  LMP 08/21/2013 No data found.   Physical Exam  Constitutional: She is oriented to person, place, and time. She appears well-developed and well-nourished.  HENT:  Head: Normocephalic and atraumatic.  Eyes: EOM are normal.  Neck: Normal range of motion.  Cardiovascular: Normal rate.   Right foot Cap refill < 3 seconds  Pulmonary/Chest: Effort normal.  Musculoskeletal: Normal range of motion. She exhibits tenderness. She exhibits no edema.       Feet:  Right foot: 3rd & 4th toes- full ROM, tenderness. Tenderness to dorsum of foot.   Calf is soft, non-tender.  Neurological: She is alert and oriented to person, place, and time.  Right foot: normal sensation  Skin: Skin is warm and dry. There is erythema.  Right foot between 3rd & 4th toe: moistened white skin, surrounding erythema with tenderness. No bleeding or discharge.   Psychiatric: She has a normal mood and affect. Her behavior is normal.  Nursing note and vitals reviewed.   ED Course  Procedures (including critical care time)  Labs Review Labs Reviewed - No  data to display  Imaging Review No results found.    MDM   1. Cellulitis of right foot   2. Tinea pedis of right foot    Exam c/w cellulitis secondary to tinea pedis of Right foot.  Rx: Clotrimazole and Keflex  Home care instructions provided. F/u with PCP in 3-4 days for recheck of symptoms, sooner if worsening. Patient and daugher verbalized understanding and agreement with treatment plan.    Noland Fordyce, PA-C 06/29/15 1341

## 2015-10-14 ENCOUNTER — Emergency Department (HOSPITAL_COMMUNITY)
Admission: EM | Admit: 2015-10-14 | Discharge: 2015-10-14 | Disposition: A | Discharge status: Home / Self Care | Payer: BLUE CROSS/BLUE SHIELD | Attending: Emergency Medicine | Admitting: Emergency Medicine

## 2015-10-14 ENCOUNTER — Encounter (HOSPITAL_COMMUNITY): Payer: Self-pay

## 2015-10-14 ENCOUNTER — Emergency Department (HOSPITAL_COMMUNITY): Payer: BLUE CROSS/BLUE SHIELD

## 2015-10-14 DIAGNOSIS — Z79899 Other long term (current) drug therapy: Secondary | ICD-10-CM | POA: Diagnosis not present

## 2015-10-14 DIAGNOSIS — Z7984 Long term (current) use of oral hypoglycemic drugs: Secondary | ICD-10-CM | POA: Insufficient documentation

## 2015-10-14 DIAGNOSIS — E119 Type 2 diabetes mellitus without complications: Secondary | ICD-10-CM | POA: Diagnosis not present

## 2015-10-14 DIAGNOSIS — I10 Essential (primary) hypertension: Secondary | ICD-10-CM | POA: Diagnosis not present

## 2015-10-14 DIAGNOSIS — M549 Dorsalgia, unspecified: Secondary | ICD-10-CM | POA: Diagnosis not present

## 2015-10-14 DIAGNOSIS — M25512 Pain in left shoulder: Secondary | ICD-10-CM | POA: Insufficient documentation

## 2015-10-14 DIAGNOSIS — R079 Chest pain, unspecified: Secondary | ICD-10-CM | POA: Diagnosis present

## 2015-10-14 LAB — COMPREHENSIVE METABOLIC PANEL
ALBUMIN: 3.9 g/dL (ref 3.5–5.0)
ALT: 19 U/L (ref 14–54)
ANION GAP: 10 (ref 5–15)
AST: 23 U/L (ref 15–41)
Alkaline Phosphatase: 81 U/L (ref 38–126)
BUN: 11 mg/dL (ref 6–20)
CO2: 23 mmol/L (ref 22–32)
Calcium: 9.4 mg/dL (ref 8.9–10.3)
Chloride: 107 mmol/L (ref 101–111)
Creatinine, Ser: 0.54 mg/dL (ref 0.44–1.00)
GFR calc Af Amer: >60 mL/min (ref >60)
GFR calc non Af Amer: >60 mL/min (ref >60)
GLUCOSE: 131 mg/dL — AB (ref 65–99)
POTASSIUM: 3.8 mmol/L (ref 3.5–5.1)
SODIUM: 140 mmol/L (ref 135–145)
Total Bilirubin: 0.7 mg/dL (ref 0.3–1.2)
Total Protein: 6.8 g/dL (ref 6.5–8.1)

## 2015-10-14 LAB — CBC
HCT: 37.6 % (ref 36.0–46.0)
HEMOGLOBIN: 12.2 g/dL (ref 12.0–15.0)
MCH: 25.7 pg — ABNORMAL LOW (ref 26.0–34.0)
MCHC: 32.4 g/dL (ref 30.0–36.0)
MCV: 79.3 fL (ref 78.0–100.0)
Platelets: 209 10*3/uL (ref 150–400)
RBC: 4.74 MIL/uL (ref 3.87–5.11)
RDW: 14.4 % (ref 11.5–15.5)
WBC: 11.3 10*3/uL — AB (ref 4.0–10.5)

## 2015-10-14 LAB — APTT: APTT: 36 s (ref 24–36)

## 2015-10-14 LAB — I-STAT TROPONIN, ED: TROPONIN I, POC: 0 ng/mL (ref 0.00–0.08)

## 2015-10-14 LAB — PROTIME-INR
INR: 0.96
Prothrombin Time: 12.7 seconds (ref 11.4–15.2)

## 2015-10-14 NOTE — ED Triage Notes (Signed)
Pt presents with 1 week h/o chest pain.  Pt reports pain to L side, radiates into L shoulder and L upper back.  Pt reports shortness of breath, reports with deep inspiration, pain worsens.  +nausea; pt was at [facility] and referred here.  ASA 324 mg and NTG x 1 given PTA.

## 2015-10-14 NOTE — Discharge Instructions (Signed)
You likely have shoulder pain. You can take ibuprofen 500 mg every 6 hours as needed to help with this pain. Please follow up with your primary care physician  as may benefit from physical therapy.

## 2015-10-14 NOTE — ED Notes (Signed)
Physician at bedside.

## 2015-10-14 NOTE — ED Provider Notes (Signed)
[facility] DEPT Provider Note   CSN: [id number] Arrival date & time: 10/14/15  1141     History   Chief Complaint Chief Complaint  Patient presents with  . Chest Pain    HPI Andrea Mclaughlin is a 50 y.o. female pmhx significant for T2DM and HTN presenting with chest, back, shoulder pain on left side. Patient was told by her PCP to come here for evaluation.  States that this pain has been going on for about 1 week and is constant, with intermittent severe spasms. She states the pain improves with activity. Indicates that the pain is about the same when she is laying down, with no improvement. She indicates at times being nauseated, she has a history of being nauseated with certain foods, however feels she's been slightly more nauseated recently. She denies any shortness of breath associated with the pain but at times feels when she is active she is little short of breath. This is chronic in nature. 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     HPI  Past Medical History:  Diagnosis Date  . Aching headache   . Anemia   . Complication of anesthesia   . Diabetes mellitus   . Hypertension   . Spinal headache     Patient Active Problem List   Diagnosis Date Noted  . S/P TAH (total abdominal hysterectomy) 11/09/2013    Class: Status post  . S/P removal of right ovary 11/09/2013    Class: Status post  . Menorrhagia 09/16/2013    Class: Present on Admission  . HYPERTENSION, BENIGN ESSENTIAL 12/05/2009  . DIABETES MELLITUS, TYPE II 10/27/2009  . ANEMIA 10/27/2009  . HEEL PAIN, LEFT 10/27/2009  . HYPERTRIGLYCERIDEMIA 10/05/2009  . PYELONEPHRITIS 10/05/2009    Past Surgical History:  Procedure Laterality Date  . ABDOMINAL HYSTERECTOMY N/A 11/09/2013   Procedure: HYSTERECTOMY ABDOMINAL;  Surgeon: Darlyn Chamber, MD;  Location: [facility];  Service: Gynecology;  Laterality: N/A;  . APPENDECTOMY    . BILATERAL SALPINGECTOMY Bilateral  11/09/2013   Procedure: BILATERAL SALPINGECTOMY;  Surgeon: Darlyn Chamber, MD;  Location: [facility];  Service: Gynecology;  Laterality: Bilateral;  . CESAREAN SECTION    . DILITATION & CURRETTAGE/HYSTROSCOPY WITH HYDROTHERMAL ABLATION N/A 09/16/2013   Procedure: DILATATION & CURETTAGE/HYSTEROSCOPY WITH HYDROTHERMAL ABLATION;  Surgeon: Darlyn Chamber, MD;  Location: [facility];  Service: Gynecology;  Laterality: N/A;  Failed HTA  . DILITATION & CURRETTAGE/HYSTROSCOPY WITH THERMACHOICE ABLATION N/A 09/16/2013   Procedure: DILATATION & CURETTAGE/HYSTEROSCOPY WITH THERMACHOICE ABLATION;  Surgeon: Darlyn Chamber, MD;  Location: [facility];  Service: Gynecology;  Laterality: N/A;  . LAPAROSCOPY N/A 11/09/2013   Procedure: LAPAROSCOPY DIAGNOSTIC;  Surgeon: Darlyn Chamber, MD;  Location: [facility];  Service: Gynecology;  Laterality: N/A;  . OOPHORECTOMY Right 11/09/2013   Procedure: OOPHORECTOMY;  Surgeon: Darlyn Chamber, MD;  Location: [facility];  Service: Gynecology;  Laterality: Right;    OB History    Gravida Para Term Preterm AB Living   2 2 2     2    SAB TAB Ectopic Multiple Live Births                   Home Medications    Prior to Admission medications   Medication Sig Start Date End Date Taking? Authorizing Provider  Acetaminophen (TYLENOL PO) Take 2 tablets by mouth daily as needed (for pain).    Historical Provider, MD  cephALEXin (KEFLEX) 500 MG capsule Take 1 capsule (500 mg total) by mouth 2 (two) times daily. For 7 days 06/29/15   Noland Fordyce, PA-C  clotrimazole (LOTRIMIN) 1 % cream Apply to affected area 2 times daily 06/29/15   Noland Fordyce, PA-C  ibuprofen (ADVIL,MOTRIN) 200 MG tablet Take 400 mg by mouth every 6 (six) hours as needed.    Historical Provider, MD  lisinopril-hydrochlorothiazide (PRINZIDE,ZESTORETIC) 20-25 MG per tablet Take 1 tablet by mouth daily.    Historical Provider, MD  metFORMIN (GLUCOPHAGE) 500 MG tablet Take 500 mg by mouth 2 (two) times daily with a meal.    Historical  Provider, MD  OVER THE COUNTER MEDICATION Take 1 tablet by mouth daily. Patient says she takes over the counter Iron    Historical Provider, MD  oxyCODONE-acetaminophen (PERCOCET) 7.5-325 MG per tablet Take 1 tablet by mouth every 4 (four) hours as needed for pain. 09/16/13   Arvella Nigh, MD  oxyCODONE-acetaminophen (PERCOCET) 7.5-325 MG per tablet Take 1 tablet by mouth every 4 (four) hours as needed for pain. 11/11/13   Arvella Nigh, MD  traMADol (ULTRAM) 50 MG tablet Take 1 tablet (50 mg total) by mouth every 6 (six) hours as needed. 06/29/15   Noland Fordyce, PA-C  vitamin C (ASCORBIC ACID) 500 MG tablet Take 500 mg by mouth daily.    Historical Provider, MD    Family History Family History  Problem Relation Age of Onset  . Anesthesia problems Neg Hx     Social History Social History  Substance Use Topics  . Smoking status: Never Smoker  . Smokeless tobacco: Never Used  . Alcohol use No     Allergies   Review of patient's allergies indicates no known allergies.   Review of Systems Review of Systems  Constitutional: Negative for chills and fever.  HENT: Negative for congestion and rhinorrhea.   Eyes: Negative for itching and visual disturbance.  Respiratory: Negative for shortness of breath.   Cardiovascular: Positive for chest pain.  Gastrointestinal: Negative for abdominal pain, nausea and vomiting.  Genitourinary: Negative for dysuria and frequency.  Musculoskeletal: Positive for back pain and neck pain.     Physical Exam Updated Vital Signs BP 160/100   Pulse 77   Temp 98.3 F (36.8 C) (Oral)   Resp 18   Ht 5' 1.25" (1.556 m)   Wt 64.4 kg   LMP 08/21/2013   SpO2 99%   BMI 26.61 kg/m   Physical Exam  Constitutional: She is oriented to person, place, and time. She appears well-developed and well-nourished.  HENT:  Head: Normocephalic and atraumatic.  Eyes: Conjunctivae are normal.  Neck: Normal range of motion. Neck supple.  Cardiovascular: Normal rate,  regular rhythm, normal heart sounds and intact distal pulses.   Pulmonary/Chest: Effort normal and breath sounds normal.  Abdominal: Soft. Bowel sounds are normal.  Musculoskeletal:       Left shoulder: She exhibits decreased range of motion and tenderness.       Arms: Patient with tenderness to palpation along the left chest and upper left back along the paraspinal muscles   Neurological: She is alert and oriented to person, place, and time.     ED Treatments / Results  Labs (all labs ordered are listed, but only abnormal results are displayed) Labs Reviewed  APTT  CBC  COMPREHENSIVE METABOLIC PANEL  Alphonzo Lemmings, ED    EKG  EKG Interpretation  Date/Time:  Friday October 14 2015 11:51:33 EDT Ventricular Rate:  73 PR Interval:    QRS Duration: 83 QT Interval:  404 QTC Calculation: 446 R Axis:   74 Text Interpretation:  Sinus rhythm No significant change since last tracing Confirmed by [facility] MD, Corene Cornea [phone]) on 10/14/2015 11:55:55 AM       Radiology No results found.  Procedures Procedures (including critical care time)  Medications Ordered in ED Medications - No data to display   Initial Impression / Assessment and Plan / ED Course  I have reviewed the triage vital signs and the nursing notes.  Pertinent labs & imaging results that were available during my care of the patient were reviewed by me and considered in my medical decision making (see chart for details).  Clinical Course    Patient presenting from chest pain, shoulder and back pain on the left side likely musculoskeletal in nature. Very low likelihood of ACS based on patient's description of pain, and considering it is reproducible on exam. Patient to follow up with PCP for possibly referral for physical therapy.   Final Clinical Impressions(s) / ED Diagnoses   Final diagnoses:  None    New Prescriptions New Prescriptions   No medications on file     Cheri Ayotte Cletis Media, MD 10/14/15 [place], MD 10/14/15 [phone]

## 2015-10-14 NOTE — ED Notes (Signed)
Pt transported to xray 

## 2016-01-12 ENCOUNTER — Other Ambulatory Visit: Payer: Self-pay | Admitting: Gastroenterology

## 2016-01-12 DIAGNOSIS — R10814 Left lower quadrant abdominal tenderness: Secondary | ICD-10-CM

## 2016-01-17 ENCOUNTER — Ambulatory Visit
Admission: RE | Admit: 2016-01-17 | Discharge: 2016-01-17 | Disposition: A | Discharge status: Home / Self Care | Payer: BLUE CROSS/BLUE SHIELD | Source: Ambulatory Visit | Attending: Gastroenterology | Admitting: Gastroenterology

## 2016-01-17 DIAGNOSIS — R10814 Left lower quadrant abdominal tenderness: Secondary | ICD-10-CM

## 2016-01-17 MED ORDER — IOPAMIDOL (ISOVUE-300) INJECTION 61%
100.0000 mL | Freq: Once | INTRAVENOUS | Status: AC | PRN
  Administered 2016-01-17: 100 mL via INTRAVENOUS

## 2017-07-27 ENCOUNTER — Ambulatory Visit (INDEPENDENT_AMBULATORY_CARE_PROVIDER_SITE_OTHER): Payer: Worker's Compensation | Admitting: Urgent Care

## 2017-07-27 ENCOUNTER — Encounter: Payer: Self-pay | Admitting: Urgent Care

## 2017-07-27 ENCOUNTER — Other Ambulatory Visit: Payer: Self-pay

## 2017-07-27 ENCOUNTER — Other Ambulatory Visit: Payer: Self-pay | Admitting: *Deleted

## 2017-07-27 VITALS — BP 123/80 | HR 69 | Temp 98.3°F | Ht 61.2 in | Wt 139.0 lb

## 2017-07-27 DIAGNOSIS — S61216A Laceration without foreign body of right little finger without damage to nail, initial encounter: Secondary | ICD-10-CM | POA: Diagnosis not present

## 2017-07-27 DIAGNOSIS — M79644 Pain in right finger(s): Secondary | ICD-10-CM

## 2017-07-27 DIAGNOSIS — Z23 Encounter for immunization: Secondary | ICD-10-CM

## 2017-07-27 DIAGNOSIS — Z026 Encounter for examination for insurance purposes: Secondary | ICD-10-CM

## 2017-07-27 MED ORDER — MELOXICAM 7.5 MG PO TABS: 7.5000 mg | ORAL_TABLET | Freq: Every day | ORAL | 0 refills | Status: DC

## 2017-07-27 NOTE — Progress Notes (Signed)
    MRN: [id number] DOB: 1965-10-12  Subjective:   Andrea Mclaughlin is a 52 y.o. female presenting for worker's comp visit. Reports that she was carrying a plastic bin of ice, lost control of it and suffered a laceration of her skin. Has cleaned her wound profusely, used peroxide, applied pressure dressing and has stopped the bleeding. She cannot recall her last tdap. Denies loss of ROM, loss of sensation, red streaks, erythema, warmth, foreign body sensation.   Andrea Mclaughlin's medications list, allergies, past medical history, past surgical history, family history and immunizations were reviewed and excluded from this note due to being a worker's comp case.   Objective:   Vitals: BP 123/80 (BP Location: Left Arm, Patient Position: Sitting, Cuff Size: Large)   Pulse 69   Temp 98.3 F (36.8 C)   Ht 5' 1.2" (1.554 m)   Wt 139 lb (63 kg)   LMP 08/21/2013   SpO2 99%   BMI 26.09 kg/m   Physical Exam  Constitutional: She is oriented to person, place, and time. She appears well-developed and well-nourished.  Cardiovascular: Normal rate.  Pulmonary/Chest: Effort normal.  Musculoskeletal:       Hands: Neurological: She is alert and oriented to person, place, and time.   PROCEDURE NOTE: laceration repair Verbal consent obtained from patient.  Local anesthesia with 2cc 0.5% Marcaine.  Wound explored for tendon, ligament damage. Wound scrubbed with soap and water and rinsed. Applied xeroform in folded pressure type dressing. Wound cleansed and dressed with non-adherent and Coband.    Assessment and Plan :   Encounter related to worker's compensation claim  Laceration of right little finger without foreign body without damage to nail, initial encounter  Finger pain, right  Wound care provided. Patient is to change dressings at home. Return-to-clinic precautions discussed, patient verbalized understanding. Otherwise, will follow up in 10 days.  Jaynee Eagles, PA-C [facility] at  [facility] [phone] 07/27/2017 12:16 PM

## 2017-07-27 NOTE — Patient Instructions (Signed)
Please change the dressing daily. Use Xeroform (the gauze with antibiotic) directly over the wound, then non-adherent dressing over the xero-form and secure it with Coband. To remove the dressing, soak in warm soapy water and gently peel back the wound. If there is bleeding, apply firm constant pressure for a total of 10 straight minutes. If she develops fever, redness, worsening pain, drainage of pus or bleeding, then return to the clinic. Otherwise, I will see her back in 10 days.

## 2017-08-06 ENCOUNTER — Ambulatory Visit: Payer: Self-pay | Admitting: Urgent Care

## 2019-11-05 LAB — HM PAP SMEAR: HM Pap smear: NEGATIVE

## 2019-11-05 LAB — RESULTS CONSOLE HPV: CHL HPV: NEGATIVE

## 2020-03-03 DIAGNOSIS — D72829 Elevated white blood cell count, unspecified: Secondary | ICD-10-CM | POA: Diagnosis not present

## 2020-03-14 ENCOUNTER — Other Ambulatory Visit: Payer: Self-pay | Admitting: Internal Medicine

## 2020-03-14 DIAGNOSIS — R7989 Other specified abnormal findings of blood chemistry: Secondary | ICD-10-CM

## 2020-03-17 DIAGNOSIS — M2550 Pain in unspecified joint: Secondary | ICD-10-CM | POA: Diagnosis not present

## 2020-03-17 DIAGNOSIS — M791 Myalgia, unspecified site: Secondary | ICD-10-CM | POA: Diagnosis not present

## 2020-03-17 DIAGNOSIS — I10 Essential (primary) hypertension: Secondary | ICD-10-CM | POA: Diagnosis not present

## 2020-03-17 DIAGNOSIS — E119 Type 2 diabetes mellitus without complications: Secondary | ICD-10-CM | POA: Diagnosis not present

## 2020-03-28 DIAGNOSIS — Z794 Long term (current) use of insulin: Secondary | ICD-10-CM | POA: Diagnosis not present

## 2020-03-28 DIAGNOSIS — E119 Type 2 diabetes mellitus without complications: Secondary | ICD-10-CM | POA: Diagnosis not present

## 2020-03-28 DIAGNOSIS — M25532 Pain in left wrist: Secondary | ICD-10-CM | POA: Diagnosis not present

## 2020-03-28 DIAGNOSIS — M25432 Effusion, left wrist: Secondary | ICD-10-CM | POA: Diagnosis not present

## 2020-03-28 DIAGNOSIS — M25512 Pain in left shoulder: Secondary | ICD-10-CM | POA: Diagnosis not present

## 2020-03-28 DIAGNOSIS — M791 Myalgia, unspecified site: Secondary | ICD-10-CM | POA: Diagnosis not present

## 2020-03-28 DIAGNOSIS — M6740 Ganglion, unspecified site: Secondary | ICD-10-CM | POA: Diagnosis not present

## 2020-03-28 DIAGNOSIS — M7989 Other specified soft tissue disorders: Secondary | ICD-10-CM | POA: Diagnosis not present

## 2020-04-12 DIAGNOSIS — M791 Myalgia, unspecified site: Secondary | ICD-10-CM | POA: Diagnosis not present

## 2020-04-12 DIAGNOSIS — M659 Synovitis and tenosynovitis, unspecified: Secondary | ICD-10-CM | POA: Diagnosis not present

## 2020-04-12 DIAGNOSIS — M2550 Pain in unspecified joint: Secondary | ICD-10-CM | POA: Diagnosis not present

## 2020-04-22 DIAGNOSIS — M67931 Unspecified disorder of synovium and tendon, right forearm: Secondary | ICD-10-CM | POA: Diagnosis not present

## 2020-04-22 DIAGNOSIS — E119 Type 2 diabetes mellitus without complications: Secondary | ICD-10-CM | POA: Diagnosis not present

## 2020-04-22 DIAGNOSIS — M67932 Unspecified disorder of synovium and tendon, left forearm: Secondary | ICD-10-CM | POA: Diagnosis not present

## 2020-04-25 DIAGNOSIS — M67932 Unspecified disorder of synovium and tendon, left forearm: Secondary | ICD-10-CM | POA: Diagnosis not present

## 2020-05-04 DIAGNOSIS — M791 Myalgia, unspecified site: Secondary | ICD-10-CM | POA: Diagnosis not present

## 2020-05-04 DIAGNOSIS — M2550 Pain in unspecified joint: Secondary | ICD-10-CM | POA: Diagnosis not present

## 2020-05-18 DIAGNOSIS — M659 Synovitis and tenosynovitis, unspecified: Secondary | ICD-10-CM | POA: Diagnosis not present

## 2020-05-18 DIAGNOSIS — Z789 Other specified health status: Secondary | ICD-10-CM | POA: Diagnosis not present

## 2020-05-18 DIAGNOSIS — M778 Other enthesopathies, not elsewhere classified: Secondary | ICD-10-CM | POA: Diagnosis not present

## 2020-05-18 DIAGNOSIS — M79642 Pain in left hand: Secondary | ICD-10-CM | POA: Diagnosis not present

## 2020-05-18 DIAGNOSIS — M79641 Pain in right hand: Secondary | ICD-10-CM | POA: Diagnosis not present

## 2020-05-24 DIAGNOSIS — Z111 Encounter for screening for respiratory tuberculosis: Secondary | ICD-10-CM | POA: Diagnosis not present

## 2020-05-24 DIAGNOSIS — M7989 Other specified soft tissue disorders: Secondary | ICD-10-CM | POA: Diagnosis not present

## 2020-05-24 DIAGNOSIS — M791 Myalgia, unspecified site: Secondary | ICD-10-CM | POA: Diagnosis not present

## 2020-05-24 DIAGNOSIS — M130 Polyarthritis, unspecified: Secondary | ICD-10-CM | POA: Diagnosis not present

## 2020-06-01 DIAGNOSIS — M659 Synovitis and tenosynovitis, unspecified: Secondary | ICD-10-CM | POA: Diagnosis not present

## 2020-06-01 DIAGNOSIS — M778 Other enthesopathies, not elsewhere classified: Secondary | ICD-10-CM | POA: Diagnosis not present

## 2020-06-01 DIAGNOSIS — M79642 Pain in left hand: Secondary | ICD-10-CM | POA: Diagnosis not present

## 2020-06-01 DIAGNOSIS — Z789 Other specified health status: Secondary | ICD-10-CM | POA: Diagnosis not present

## 2020-06-01 DIAGNOSIS — M79641 Pain in right hand: Secondary | ICD-10-CM | POA: Diagnosis not present

## 2020-06-16 DIAGNOSIS — Z794 Long term (current) use of insulin: Secondary | ICD-10-CM | POA: Diagnosis not present

## 2020-06-16 DIAGNOSIS — I10 Essential (primary) hypertension: Secondary | ICD-10-CM | POA: Diagnosis not present

## 2020-06-16 DIAGNOSIS — M0600 Rheumatoid arthritis without rheumatoid factor, unspecified site: Secondary | ICD-10-CM | POA: Diagnosis not present

## 2020-06-16 DIAGNOSIS — E119 Type 2 diabetes mellitus without complications: Secondary | ICD-10-CM | POA: Diagnosis not present

## 2020-06-21 DIAGNOSIS — M130 Polyarthritis, unspecified: Secondary | ICD-10-CM | POA: Diagnosis not present

## 2020-06-21 DIAGNOSIS — M064 Inflammatory polyarthropathy: Secondary | ICD-10-CM | POA: Diagnosis not present

## 2020-06-21 DIAGNOSIS — M7989 Other specified soft tissue disorders: Secondary | ICD-10-CM | POA: Diagnosis not present

## 2020-06-21 DIAGNOSIS — M791 Myalgia, unspecified site: Secondary | ICD-10-CM | POA: Diagnosis not present

## 2020-07-19 DIAGNOSIS — M064 Inflammatory polyarthropathy: Secondary | ICD-10-CM | POA: Diagnosis not present

## 2020-08-03 DIAGNOSIS — D649 Anemia, unspecified: Secondary | ICD-10-CM | POA: Diagnosis not present

## 2020-08-03 DIAGNOSIS — R011 Cardiac murmur, unspecified: Secondary | ICD-10-CM | POA: Diagnosis not present

## 2020-08-03 DIAGNOSIS — M791 Myalgia, unspecified site: Secondary | ICD-10-CM | POA: Diagnosis not present

## 2020-08-03 DIAGNOSIS — Z20822 Contact with and (suspected) exposure to covid-19: Secondary | ICD-10-CM | POA: Diagnosis not present

## 2020-08-03 DIAGNOSIS — J029 Acute pharyngitis, unspecified: Secondary | ICD-10-CM | POA: Diagnosis not present

## 2020-08-08 DIAGNOSIS — E538 Deficiency of other specified B group vitamins: Secondary | ICD-10-CM | POA: Diagnosis not present

## 2020-08-09 DIAGNOSIS — E538 Deficiency of other specified B group vitamins: Secondary | ICD-10-CM | POA: Diagnosis not present

## 2020-08-10 DIAGNOSIS — E538 Deficiency of other specified B group vitamins: Secondary | ICD-10-CM | POA: Diagnosis not present

## 2020-08-11 DIAGNOSIS — E538 Deficiency of other specified B group vitamins: Secondary | ICD-10-CM | POA: Diagnosis not present

## 2020-08-18 DIAGNOSIS — E538 Deficiency of other specified B group vitamins: Secondary | ICD-10-CM | POA: Diagnosis not present

## 2020-08-25 DIAGNOSIS — E538 Deficiency of other specified B group vitamins: Secondary | ICD-10-CM | POA: Diagnosis not present

## 2020-09-01 DIAGNOSIS — E538 Deficiency of other specified B group vitamins: Secondary | ICD-10-CM | POA: Diagnosis not present

## 2020-09-08 DIAGNOSIS — E538 Deficiency of other specified B group vitamins: Secondary | ICD-10-CM | POA: Diagnosis not present

## 2020-09-23 ENCOUNTER — Emergency Department (HOSPITAL_COMMUNITY)
Admission: EM | Admit: 2020-09-23 | Discharge: 2020-09-23 | Disposition: A | Discharge status: Home / Self Care | Payer: BC Managed Care – PPO | Attending: Emergency Medicine | Admitting: Emergency Medicine

## 2020-09-23 ENCOUNTER — Other Ambulatory Visit: Payer: Self-pay

## 2020-09-23 DIAGNOSIS — R739 Hyperglycemia, unspecified: Secondary | ICD-10-CM

## 2020-09-23 DIAGNOSIS — Z79899 Other long term (current) drug therapy: Secondary | ICD-10-CM | POA: Insufficient documentation

## 2020-09-23 DIAGNOSIS — Z7984 Long term (current) use of oral hypoglycemic drugs: Secondary | ICD-10-CM | POA: Insufficient documentation

## 2020-09-23 DIAGNOSIS — M79605 Pain in left leg: Secondary | ICD-10-CM | POA: Insufficient documentation

## 2020-09-23 DIAGNOSIS — G8929 Other chronic pain: Secondary | ICD-10-CM

## 2020-09-23 DIAGNOSIS — M79602 Pain in left arm: Secondary | ICD-10-CM | POA: Insufficient documentation

## 2020-09-23 DIAGNOSIS — M79601 Pain in right arm: Secondary | ICD-10-CM | POA: Insufficient documentation

## 2020-09-23 DIAGNOSIS — E1165 Type 2 diabetes mellitus with hyperglycemia: Secondary | ICD-10-CM | POA: Insufficient documentation

## 2020-09-23 DIAGNOSIS — M79604 Pain in right leg: Secondary | ICD-10-CM | POA: Insufficient documentation

## 2020-09-23 DIAGNOSIS — I10 Essential (primary) hypertension: Secondary | ICD-10-CM | POA: Insufficient documentation

## 2020-09-23 LAB — URINALYSIS, ROUTINE W REFLEX MICROSCOPIC
Bilirubin Urine: NEGATIVE
Glucose, UA: >=500 mg/dL — AB
Hgb urine dipstick: NEGATIVE
Ketones, ur: NEGATIVE mg/dL
Leukocytes,Ua: NEGATIVE
Nitrite: NEGATIVE
Protein, ur: NEGATIVE mg/dL
Specific Gravity, Urine: 1.008 (ref 1.005–1.030)
pH: 6 (ref 5.0–8.0)

## 2020-09-23 LAB — CBC WITH DIFFERENTIAL/PLATELET
Abs Immature Granulocytes: 0.02 10*3/uL (ref 0.00–0.07)
Basophils Absolute: 0.1 10*3/uL (ref 0.0–0.1)
Basophils Relative: 1 %
Eosinophils Absolute: 0.3 10*3/uL (ref 0.0–0.5)
Eosinophils Relative: 4 %
HCT: 34.5 % — ABNORMAL LOW (ref 36.0–46.0)
Hemoglobin: 10.6 g/dL — ABNORMAL LOW (ref 12.0–15.0)
Immature Granulocytes: 0 %
Lymphocytes Relative: 25 %
Lymphs Abs: 1.5 10*3/uL (ref 0.7–4.0)
MCH: 23.2 pg — ABNORMAL LOW (ref 26.0–34.0)
MCHC: 30.7 g/dL (ref 30.0–36.0)
MCV: 75.7 fL — ABNORMAL LOW (ref 80.0–100.0)
Monocytes Absolute: 0.6 10*3/uL (ref 0.1–1.0)
Monocytes Relative: 10 %
Neutro Abs: 3.6 10*3/uL (ref 1.7–7.7)
Neutrophils Relative %: 60 %
Platelets: 234 10*3/uL (ref 150–400)
RBC: 4.56 MIL/uL (ref 3.87–5.11)
RDW: 16 % — ABNORMAL HIGH (ref 11.5–15.5)
WBC: 6.1 10*3/uL (ref 4.0–10.5)
nRBC: 0 % (ref 0.0–0.2)

## 2020-09-23 LAB — COMPREHENSIVE METABOLIC PANEL
ALT: 29 U/L (ref 0–44)
AST: 24 U/L (ref 15–41)
Albumin: 3.9 g/dL (ref 3.5–5.0)
Alkaline Phosphatase: 79 U/L (ref 38–126)
Anion gap: 11 (ref 5–15)
BUN: 12 mg/dL (ref 6–20)
CO2: 23 mmol/L (ref 22–32)
Calcium: 9.4 mg/dL (ref 8.9–10.3)
Chloride: 106 mmol/L (ref 98–111)
Creatinine, Ser: 0.66 mg/dL (ref 0.44–1.00)
GFR, Estimated: >60 mL/min (ref >60)
Glucose, Bld: 273 mg/dL — ABNORMAL HIGH (ref 70–99)
Potassium: 3.8 mmol/L (ref 3.5–5.1)
Sodium: 140 mmol/L (ref 135–145)
Total Bilirubin: 0.7 mg/dL (ref 0.3–1.2)
Total Protein: 6.9 g/dL (ref 6.5–8.1)

## 2020-09-23 LAB — CBG MONITORING, ED: Glucose-Capillary: 254 mg/dL — ABNORMAL HIGH (ref 70–99)

## 2020-09-23 LAB — LIPASE, BLOOD: Lipase: 41 U/L (ref 11–51)

## 2020-09-23 NOTE — Discharge Instructions (Addendum)
Follow-up with primary care for ongoing evaluation of pain. Restart diabetes medications as prescribed. Return if development of fevers, shortness of breath, chest pain, or change in mental status

## 2020-09-23 NOTE — ED Provider Notes (Signed)
[facility] EMERGENCY DEPARTMENT Provider Note   CSN: [id number] Arrival date & time: 09/23/20  1525     History Chief Complaint  Patient presents with   Hyperglycemia    Andrea Mclaughlin L Andrea Mclaughlin is a 55 y.o. female.  Patient with past medical history of diabetes presents today with chief complaint of hyperglycemia and diffuse bilateral upper and lower extremity pain. Patient is Spanish speaking and history translated by daughter who states that the patient has long standing diabetes which has been managed by metformin and insulin. Over a year ago, the patient began having diffuse nonspecific bilateral upper and lower extremity pain accompanied with some moderate swelling. Pain is aching in nature. Patient has been worked up by primary care and rheumatology who seem to have not been successful and finding a cause for these symptoms. Pain is unchanged from baseline over the past year at this time. Apparently 3 days ago, patient missed a dose of her metformin and insulin and had less pain that day, so she thought that could potentially be the cause of her pain so she stopped these medications. Unfortunately, patient's pain returned the next day, but she continued to not take medication. Patient denies fevers, chills, changes in mentation, chest pain, shortness of breath, palpitations, nausea, vomiting, diarrhea at this time.  The history is provided by the patient and a relative. A language interpreter was used.  Hyperglycemia Associated symptoms: no chest pain, no confusion, no dizziness, no fever, no increased thirst, no nausea, no polyuria, no shortness of breath and no vomiting       Past Medical History:  Diagnosis Date   Aching headache    Anemia    Complication of anesthesia    Diabetes mellitus    Hypertension    Spinal headache     Patient Active Problem List   Diagnosis Date Noted   S/P TAH (total abdominal hysterectomy) 11/09/2013    Class: Status post    S/P removal of right ovary 11/09/2013    Class: Status post   Menorrhagia 09/16/2013    Class: Present on Admission   HYPERTENSION, BENIGN ESSENTIAL 12/05/2009   DIABETES MELLITUS, TYPE II 10/27/2009   ANEMIA 10/27/2009   HEEL PAIN, LEFT 10/27/2009   HYPERTRIGLYCERIDEMIA 10/05/2009   PYELONEPHRITIS 10/05/2009    Past Surgical History:  Procedure Laterality Date   ABDOMINAL HYSTERECTOMY N/A 11/09/2013   Procedure: HYSTERECTOMY ABDOMINAL;  Surgeon: Darlyn Chamber, MD;  Location: [facility];  Service: Gynecology;  Laterality: N/A;   APPENDECTOMY     BILATERAL SALPINGECTOMY Bilateral 11/09/2013   Procedure: BILATERAL SALPINGECTOMY;  Surgeon: Darlyn Chamber, MD;  Location: [facility];  Service: Gynecology;  Laterality: Bilateral;   CESAREAN SECTION     DILITATION & CURRETTAGE/HYSTROSCOPY WITH HYDROTHERMAL ABLATION N/A 09/16/2013   Procedure: DILATATION & CURETTAGE/HYSTEROSCOPY WITH HYDROTHERMAL ABLATION;  Surgeon: Darlyn Chamber, MD;  Location: [facility];  Service: Gynecology;  Laterality: N/A;  Failed HTA   DILITATION & CURRETTAGE/HYSTROSCOPY WITH THERMACHOICE ABLATION N/A 09/16/2013   Procedure: DILATATION & CURETTAGE/HYSTEROSCOPY WITH THERMACHOICE ABLATION;  Surgeon: Darlyn Chamber, MD;  Location: [facility];  Service: Gynecology;  Laterality: N/A;   LAPAROSCOPY N/A 11/09/2013   Procedure: LAPAROSCOPY DIAGNOSTIC;  Surgeon: Darlyn Chamber, MD;  Location: [facility];  Service: Gynecology;  Laterality: N/A;   OOPHORECTOMY Right 11/09/2013   Procedure: OOPHORECTOMY;  Surgeon: Darlyn Chamber, MD;  Location: [facility];  Service: Gynecology;  Laterality: Right;     OB History  Gravida  2   Para  2   Term  2   Preterm      AB      Living  2      SAB      IAB      Ectopic      Multiple      Live Births              Family History  Problem Relation Age of Onset   Anesthesia problems Neg Hx     Social History   Tobacco Use   Smoking status: Never   Smokeless tobacco: Never   Substance Use Topics   Alcohol use: No   Drug use: No    Home Medications Prior to Admission medications   Medication Sig Start Date End Date Taking? Authorizing Provider  acetaminophen (TYLENOL) 500 MG tablet Take 1,000 mg by mouth every 6 (six) hours as needed (pain).    [provider]  lisinopril-hydrochlorothiazide (PRINZIDE,ZESTORETIC) 20-25 MG per tablet Take 1 tablet by mouth at bedtime.     [provider]  meloxicam (MOBIC) 7.5 MG tablet Take 1 tablet (7.5 mg total) by mouth daily. 07/27/17   Jaynee Eagles, PA-C  metFORMIN (GLUCOPHAGE) 1000 MG tablet Take 1,000 mg by mouth 2 (two) times daily with a meal.    [provider]  Multiple Vitamin (MULTIVITAMIN WITH MINERALS) TABS tablet Take 1 tablet by mouth daily.    [provider]  oxyCODONE-acetaminophen (PERCOCET) 7.5-325 MG per tablet Take 1 tablet by mouth every 4 (four) hours as needed for pain. Patient not taking: Reported on 10/14/2015 09/16/13   Arvella Nigh, MD  oxyCODONE-acetaminophen (PERCOCET) 7.5-325 MG per tablet Take 1 tablet by mouth every 4 (four) hours as needed for pain. Patient not taking: Reported on 10/14/2015 11/11/13   Arvella Nigh, MD  pimecrolimus (ELIDEL) 1 % cream Apply 1 application topically 2 (two) times daily. For Vitiligo    [provider]  traMADol (ULTRAM) 50 MG tablet Take 1 tablet (50 mg total) by mouth every 6 (six) hours as needed. 06/29/15   Noe Gens, PA-C    Allergies    Metformin and related  Review of Systems   Review of Systems  Constitutional:  Negative for chills and fever.  Respiratory:  Negative for shortness of breath.   Cardiovascular:  Negative for chest pain and palpitations.  Gastrointestinal:  Negative for diarrhea, nausea and vomiting.  Endocrine: Negative for polydipsia and polyuria.  Musculoskeletal:  Positive for arthralgias and myalgias. Negative for neck pain and neck stiffness.  Neurological:  Negative for dizziness,  seizures, syncope, facial asymmetry and light-headedness.  Psychiatric/Behavioral:  Negative for agitation and confusion.    Physical Exam Updated Vital Signs BP (!) 160/86 (BP Location: Left Arm)   Pulse 96   Temp 98.4 F (36.9 C)   Resp 17   LMP 08/21/2013   SpO2 98%   Physical Exam Constitutional:      General: She is not in acute distress.    Appearance: Normal appearance. She is not ill-appearing, toxic-appearing or diaphoretic.  HENT:     Head: Normocephalic.  Cardiovascular:     Rate and Rhythm: Normal rate and regular rhythm.     Pulses: Normal pulses.          Dorsalis pedis pulses are 2+ on the right side and 2+ on the left side.       Posterior tibial pulses are 2+ on the right side and  2+ on the left side.     Heart sounds: Normal heart sounds.  Pulmonary:     Effort: Pulmonary effort is normal.     Breath sounds: Normal breath sounds and air entry.  Abdominal:     General: Abdomen is flat. Bowel sounds are normal. There is no distension.     Palpations: Abdomen is soft.  Musculoskeletal:        General: No tenderness, deformity or signs of injury.     Cervical back: Normal range of motion.     Right hip: Normal range of motion. Normal strength.     Left hip: Normal range of motion. Normal strength.     Right lower leg: No deformity or tenderness. No edema.     Left lower leg: No deformity or tenderness. No edema.     Right foot: Normal range of motion and normal capillary refill. No deformity or tenderness. Normal pulse.     Left foot: Normal range of motion and normal capillary refill. No deformity or tenderness. Normal pulse.  Feet:     Right foot:     Skin integrity: No skin breakdown, erythema or warmth.     Left foot:     Skin integrity: No skin breakdown, erythema or warmth.  Skin:    General: Skin is warm and dry.     Findings: No petechiae or wound.  Neurological:     General: No focal deficit present.     Mental Status: She is alert and oriented  to person, place, and time.     Sensory: Sensation is intact.     Motor: Motor function is intact.  Psychiatric:        Behavior: Behavior normal.    ED Results / Procedures / Treatments   Labs (all labs ordered are listed, but only abnormal results are displayed) Labs Reviewed  COMPREHENSIVE METABOLIC PANEL - Abnormal; Notable for the following components:      Result Value   Glucose, Bld 273 (*)    All other components within normal limits  CBC WITH DIFFERENTIAL/PLATELET - Abnormal; Notable for the following components:   Hemoglobin 10.6 (*)    HCT 34.5 (*)    MCV 75.7 (*)    MCH 23.2 (*)    RDW 16.0 (*)    All other components within normal limits  URINALYSIS, ROUTINE W REFLEX MICROSCOPIC - Abnormal; Notable for the following components:   Color, Urine COLORLESS (*)    Glucose, UA >=500 (*)    Bacteria, UA RARE (*)    All other components within normal limits  CBG MONITORING, ED - Abnormal; Notable for the following components:   Glucose-Capillary 254 (*)    All other components within normal limits  LIPASE, BLOOD    EKG None  Radiology No results found.  Procedures Procedures   Medications Ordered in ED Medications - No data to display  ED Course  I have reviewed the triage vital signs and the nursing notes.  Pertinent labs & imaging results that were available during my care of the patient were reviewed by me and considered in my medical decision making (see chart for details).    MDM Rules/Calculators/A&P                         Patient presents today with chief complaint of hyperglycemia after not taking long standing diabetic medications over the past 3 days. Blood glucose moderately elevated at 254, however no [place]  or anion gap appreciated. No suspicion for DKA/HHS at this time. Will advise that patient restart medications and follow-up with primary care for further medication management.   Patient also complaining of diffuse upper and lower  nonspecific bilateral extremity pain that has been unchanged for over a year. Patient is afebrile and non-toxic appearing. No specific areas of tenderness, swelling, erythema. No concern for septic arthritis, polymyositis, or other emergent infectious or autoimmune process at this time. Will advise follow-up with primary care for further evaluation of these symptoms.    Final Clinical Impression(s) / ED Diagnoses Final diagnoses:  Hyperglycemia  Chronic pain of both upper extremities  Chronic pain of both lower extremities    Rx / DC Orders ED Discharge Orders     None     An After Visit Summary was printed and given to the patient.    Bud Face, [place] 09/23/20 2014    Valarie Merino, MD 09/27/20 1006

## 2020-09-23 NOTE — ED Provider Notes (Signed)
Emergency Medicine Provider Triage Evaluation Note  Andrea Mclaughlin , a 55 y.o. female  was evaluated in triage.  Pt complains of hyperglycemia.  History is obtained from patient through professional [name] language interpreter, patient's daughter who is on the phone and speaks both [place] and [place]. Patient has had ongoing issues with inflammatory pain in her arms and legs.  She recently was on steroids however has been off of those.  For her diabetes she was originally on normal metformin, however she did not tolerate that due to the stomach upset and diarrhea and was taken off of that. She is having ongoing hyperglycemia per patient and her daughter.  She is no longer on any steroids.  She has not been taking her metformin for the past 3 days as she missed it 1 day and realized that she had less body aches and pains and her stomach felt slightly better   Review of Systems  Positive: Hyperglycemia, not taking diabetes meds, chronic abdominal pain Negative: fevers  Physical Exam  BP (!) 160/86 (BP Location: Left Arm)   Pulse 96   Temp 98.4 F (36.9 C)   Resp 17   LMP 08/21/2013   SpO2 98%  Gen:   Awake, no distress   Resp:  Normal effort  MSK:   Moves extremities without difficulty  Other:  Is awake and alert.  Speech is not slurred.  Moves all 4 extremities spontaneously.  Medical Decision Making  Medically screening exam initiated at 4:20 PM.  Appropriate orders placed.  Andrea Mclaughlin was informed that the remainder of the evaluation will be completed by another provider, this initial triage assessment does not replace that evaluation, and the importance of remaining in the ED until their evaluation is complete.  Note: Portions of this report may have been transcribed using voice recognition software. Every effort was made to ensure accuracy; however, inadvertent computerized transcription errors may be present    Andrea Mclaughlin 09/23/20 1624     Luna Fuse, MD 09/26/20 [phone]

## 2020-09-23 NOTE — ED Triage Notes (Signed)
Pt c/o hyperglycemia, states that CBG was >300 at home, 254 in triage. Inflammation in arms & legs x61yr pain started approx 920mo has been seen multiple times for same. When she didn't take her DM medication (metformin/ insulin), pain subsided. Pt placed on steroids for inflammation x1m50moBG elevated after.  9/10 current pain in arms & legs

## 2020-09-27 DIAGNOSIS — Z794 Long term (current) use of insulin: Secondary | ICD-10-CM | POA: Diagnosis not present

## 2020-09-27 DIAGNOSIS — I10 Essential (primary) hypertension: Secondary | ICD-10-CM | POA: Diagnosis not present

## 2020-09-27 DIAGNOSIS — E119 Type 2 diabetes mellitus without complications: Secondary | ICD-10-CM | POA: Diagnosis not present

## 2020-09-27 DIAGNOSIS — M1990 Unspecified osteoarthritis, unspecified site: Secondary | ICD-10-CM | POA: Diagnosis not present

## 2020-11-02 DIAGNOSIS — F4323 Adjustment disorder with mixed anxiety and depressed mood: Secondary | ICD-10-CM | POA: Diagnosis not present

## 2020-11-08 DIAGNOSIS — Z6825 Body mass index (BMI) 25.0-25.9, adult: Secondary | ICD-10-CM | POA: Diagnosis not present

## 2020-11-08 DIAGNOSIS — Z1329 Encounter for screening for other suspected endocrine disorder: Secondary | ICD-10-CM | POA: Diagnosis not present

## 2020-11-08 DIAGNOSIS — Z01419 Encounter for gynecological examination (general) (routine) without abnormal findings: Secondary | ICD-10-CM | POA: Diagnosis not present

## 2020-11-09 DIAGNOSIS — M130 Polyarthritis, unspecified: Secondary | ICD-10-CM | POA: Diagnosis not present

## 2020-11-09 DIAGNOSIS — R634 Abnormal weight loss: Secondary | ICD-10-CM | POA: Diagnosis not present

## 2020-11-09 DIAGNOSIS — M0609 Rheumatoid arthritis without rheumatoid factor, multiple sites: Secondary | ICD-10-CM | POA: Diagnosis not present

## 2020-11-10 LAB — HM DIABETES EYE EXAM

## 2020-11-16 DIAGNOSIS — F4323 Adjustment disorder with mixed anxiety and depressed mood: Secondary | ICD-10-CM | POA: Diagnosis not present

## 2020-11-17 DIAGNOSIS — D649 Anemia, unspecified: Secondary | ICD-10-CM | POA: Diagnosis not present

## 2020-11-23 DIAGNOSIS — R197 Diarrhea, unspecified: Secondary | ICD-10-CM | POA: Diagnosis not present

## 2020-11-23 DIAGNOSIS — R079 Chest pain, unspecified: Secondary | ICD-10-CM | POA: Diagnosis not present

## 2020-11-23 DIAGNOSIS — D509 Iron deficiency anemia, unspecified: Secondary | ICD-10-CM | POA: Diagnosis not present

## 2020-11-23 DIAGNOSIS — R634 Abnormal weight loss: Secondary | ICD-10-CM | POA: Diagnosis not present

## 2020-12-12 ENCOUNTER — Ambulatory Visit: Payer: BC Managed Care – PPO | Admitting: Cardiology

## 2020-12-12 NOTE — Progress Notes (Deleted)
Patient referred by Harvie Junior, MD for ***  Subjective:   Andrea Mclaughlin, female    DOB: 05/26/1965, 55 y.o.   MRN: [id number]  *** No chief complaint on file.   *** HPI  55 y.o. *** female with ***  *** Past Medical History:  Diagnosis Date   Aching headache    Anemia    Complication of anesthesia    Diabetes mellitus    Hypertension    Spinal headache     *** Past Surgical History:  Procedure Laterality Date   ABDOMINAL HYSTERECTOMY N/A 11/09/2013   Procedure: HYSTERECTOMY ABDOMINAL;  Surgeon: Darlyn Chamber, MD;  Location: [facility];  Service: Gynecology;  Laterality: N/A;   APPENDECTOMY     BILATERAL SALPINGECTOMY Bilateral 11/09/2013   Procedure: BILATERAL SALPINGECTOMY;  Surgeon: Darlyn Chamber, MD;  Location: [facility];  Service: Gynecology;  Laterality: Bilateral;   CESAREAN SECTION     DILITATION & CURRETTAGE/HYSTROSCOPY WITH HYDROTHERMAL ABLATION N/A 09/16/2013   Procedure: DILATATION & CURETTAGE/HYSTEROSCOPY WITH HYDROTHERMAL ABLATION;  Surgeon: Darlyn Chamber, MD;  Location: [facility];  Service: Gynecology;  Laterality: N/A;  Failed HTA   DILITATION & CURRETTAGE/HYSTROSCOPY WITH THERMACHOICE ABLATION N/A 09/16/2013   Procedure: DILATATION & CURETTAGE/HYSTEROSCOPY WITH THERMACHOICE ABLATION;  Surgeon: Darlyn Chamber, MD;  Location: [facility];  Service: Gynecology;  Laterality: N/A;   LAPAROSCOPY N/A 11/09/2013   Procedure: LAPAROSCOPY DIAGNOSTIC;  Surgeon: Darlyn Chamber, MD;  Location: [facility];  Service: Gynecology;  Laterality: N/A;   OOPHORECTOMY Right 11/09/2013   Procedure: OOPHORECTOMY;  Surgeon: Darlyn Chamber, MD;  Location: [facility];  Service: Gynecology;  Laterality: Right;    *** Social History   Tobacco Use  Smoking Status Never  Smokeless Tobacco Never    Social History   Substance and Sexual Activity  Alcohol Use No    *** Family History  Problem Relation Age of Onset   Anesthesia problems Neg Hx     *** Current Outpatient  Medications on File Prior to Visit  Medication Sig Dispense Refill   acetaminophen (TYLENOL) 500 MG tablet Take 1,000 mg by mouth every 6 (six) hours as needed (pain).     lisinopril-hydrochlorothiazide (PRINZIDE,ZESTORETIC) 20-25 MG per tablet Take 1 tablet by mouth at bedtime.      meloxicam (MOBIC) 7.5 MG tablet Take 1 tablet (7.5 mg total) by mouth daily. 30 tablet 0   metFORMIN (GLUCOPHAGE) 1000 MG tablet Take 1,000 mg by mouth 2 (two) times daily with a meal.     Multiple Vitamin (MULTIVITAMIN WITH MINERALS) TABS tablet Take 1 tablet by mouth daily.     oxyCODONE-acetaminophen (PERCOCET) 7.5-325 MG per tablet Take 1 tablet by mouth every 4 (four) hours as needed for pain. (Patient not taking: Reported on 10/14/2015) 30 tablet 0   oxyCODONE-acetaminophen (PERCOCET) 7.5-325 MG per tablet Take 1 tablet by mouth every 4 (four) hours as needed for pain. (Patient not taking: Reported on 10/14/2015) 30 tablet 0   pimecrolimus (ELIDEL) 1 % cream Apply 1 application topically 2 (two) times daily. For Vitiligo     traMADol (ULTRAM) 50 MG tablet Take 1 tablet (50 mg total) by mouth every 6 (six) hours as needed. 15 tablet 0   No current facility-administered medications on file prior to visit.    Cardiovascular and other pertinent studies:  *** EKG ***/***/202***: ***  No results found for this or any previous visit from the past 1095 days.    *** Recent  labs: ***/***/202***: Glucose ***, BUN/Cr ***/***. EGFR ***. Na/K ***/***. ***Rest of the CMP normal H/H ***/***. MCV ***. Platelets *** ***HbA1C ***% Chol ***, TG ***, HDL ***, LDL *** ***TSH ***normal   *** ROS      *** There were no vitals filed for this visit.   There is no height or weight on file to calculate BMI. There were no vitals filed for this visit.  *** Objective:   Physical Exam    ***     Assessment & Recommendations:   ***  ***  Thank you for referring the patient to us. Please feel free to  contact with any questions.   Manish J Patwardhan, MD Pager: [phone] Office: [phone]  

## 2020-12-20 DIAGNOSIS — Z7984 Long term (current) use of oral hypoglycemic drugs: Secondary | ICD-10-CM | POA: Diagnosis not present

## 2020-12-20 DIAGNOSIS — E781 Pure hyperglyceridemia: Secondary | ICD-10-CM | POA: Diagnosis not present

## 2020-12-20 DIAGNOSIS — E119 Type 2 diabetes mellitus without complications: Secondary | ICD-10-CM | POA: Diagnosis not present

## 2020-12-20 DIAGNOSIS — Z794 Long term (current) use of insulin: Secondary | ICD-10-CM | POA: Diagnosis not present

## 2020-12-20 DIAGNOSIS — Z1231 Encounter for screening mammogram for malignant neoplasm of breast: Secondary | ICD-10-CM | POA: Diagnosis not present

## 2020-12-20 LAB — HM MAMMOGRAPHY

## 2020-12-28 ENCOUNTER — Encounter: Payer: Self-pay | Admitting: Student

## 2020-12-28 ENCOUNTER — Ambulatory Visit: Payer: Self-pay | Admitting: Student

## 2020-12-28 ENCOUNTER — Ambulatory Visit: Payer: BC Managed Care – PPO | Admitting: Cardiology

## 2020-12-28 ENCOUNTER — Other Ambulatory Visit: Payer: Self-pay

## 2020-12-28 VITALS — BP 138/78 | HR 81 | Ht 61.2 in | Wt 136.0 lb

## 2020-12-28 DIAGNOSIS — I209 Angina pectoris, unspecified: Secondary | ICD-10-CM

## 2020-12-28 DIAGNOSIS — Z794 Long term (current) use of insulin: Secondary | ICD-10-CM

## 2020-12-28 DIAGNOSIS — F4323 Adjustment disorder with mixed anxiety and depressed mood: Secondary | ICD-10-CM | POA: Diagnosis not present

## 2020-12-28 DIAGNOSIS — R079 Chest pain, unspecified: Secondary | ICD-10-CM | POA: Diagnosis not present

## 2020-12-28 DIAGNOSIS — R5383 Other fatigue: Secondary | ICD-10-CM | POA: Diagnosis not present

## 2020-12-28 DIAGNOSIS — E119 Type 2 diabetes mellitus without complications: Secondary | ICD-10-CM | POA: Diagnosis not present

## 2020-12-28 DIAGNOSIS — I10 Essential (primary) hypertension: Secondary | ICD-10-CM | POA: Diagnosis not present

## 2020-12-28 DIAGNOSIS — E7800 Pure hypercholesterolemia, unspecified: Secondary | ICD-10-CM

## 2020-12-28 NOTE — Progress Notes (Signed)
Primary Physician/Referring:  Doreatha Lew, MD  Patient ID: Andrea Mclaughlin, female    DOB: [birth year], 55 y.o.   MRN: [id number]  Chief Complaint  Patient presents with   Chest Pain   Coronary Artery Disease   HPI:    Andrea Mclaughlin  is a 55 y.o. Hispanic female with history of hypertension, hyperlipidemia, uncontrolled diabetes mellitus.  She was referred to our office by Dr. Carol Ada as he has recommended patient undergo colonoscopy given concerns of unintentional weight loss and anemia.  Dr. Benson Norway has recommended colorectal cancer screening, however prior to colonoscopy she has been referred to our office for evaluation of exertional chest pain and fatigue.  Patient is accompanied by her daughter at today's office visit who acts as Optometrist.  Patient reports over the last 5 months she has been experiencing intermittent chest pain which she describes as pressure as well as reduced exercise tolerance.  Patient works at [employer] and notices particularly at work when she is busy or when she is exerting herself at home she develops chest pain and fatigue.  She states chest pain is substernal and resolves with rest.  On average patient's chest pain lasts approximately 5 minutes.  She denies associated shortness of breath.  However she does state some episodes of chest discomfort also, with palpitations when she exerts herself, these too resolve with rest in approximately 5 minutes.   Past Medical History:  Diagnosis Date   Aching headache    Anemia    Complication of anesthesia    Diabetes mellitus    Hyperlipidemia    Hypertension    Spinal headache    Past Surgical History:  Procedure Laterality Date   ABDOMINAL HYSTERECTOMY N/A 11/09/2013   Procedure: HYSTERECTOMY ABDOMINAL;  Surgeon: Darlyn Chamber, MD;  Location: [facility];  Service: Gynecology;  Laterality: N/A;   APPENDECTOMY     BILATERAL SALPINGECTOMY Bilateral 11/09/2013   Procedure: BILATERAL  SALPINGECTOMY;  Surgeon: Darlyn Chamber, MD;  Location: [facility];  Service: Gynecology;  Laterality: Bilateral;   CESAREAN SECTION     DILITATION & CURRETTAGE/HYSTROSCOPY WITH HYDROTHERMAL ABLATION N/A 09/16/2013   Procedure: DILATATION & CURETTAGE/HYSTEROSCOPY WITH HYDROTHERMAL ABLATION;  Surgeon: Darlyn Chamber, MD;  Location: [facility];  Service: Gynecology;  Laterality: N/A;  Failed HTA   DILITATION & CURRETTAGE/HYSTROSCOPY WITH THERMACHOICE ABLATION N/A 09/16/2013   Procedure: DILATATION & CURETTAGE/HYSTEROSCOPY WITH THERMACHOICE ABLATION;  Surgeon: Darlyn Chamber, MD;  Location: [facility];  Service: Gynecology;  Laterality: N/A;   LAPAROSCOPY N/A 11/09/2013   Procedure: LAPAROSCOPY DIAGNOSTIC;  Surgeon: Darlyn Chamber, MD;  Location: [facility];  Service: Gynecology;  Laterality: N/A;   OOPHORECTOMY Right 11/09/2013   Procedure: OOPHORECTOMY;  Surgeon: Darlyn Chamber, MD;  Location: [facility];  Service: Gynecology;  Laterality: Right;   Family History  Problem Relation Age of Onset   Hypertension Mother    Lung disease Father    Aneurysm Sister    Brain cancer Brother    Anesthesia problems Neg Hx     Social History   Tobacco Use   Smoking status: Never   Smokeless tobacco: Never  Substance Use Topics   Alcohol use: No   Marital Status: Single   ROS  Review of Systems  Constitutional: Positive for malaise/fatigue. Negative for weight gain.  Cardiovascular:  Positive for chest pain and palpitations. Negative for claudication, leg swelling, near-syncope, orthopnea, paroxysmal nocturnal dyspnea and syncope.  Respiratory:  Negative for  shortness of breath.   Neurological:  Negative for dizziness.   Objective  Blood pressure 138/78, pulse 81, height 5' 1.2" (1.554 m), weight 136 lb (61.7 kg), last menstrual period 08/21/2013, SpO2 98 %.  Vitals with BMI 12/28/2020 09/23/2020 09/23/2020  Height 5' 1.2" - -  Weight 136 lbs - -  BMI 95.09 - -  Systolic 326 712 458  Diastolic 78 93 86  Pulse 81 89  96      Physical Exam Vitals reviewed.  Cardiovascular:     Rate and Rhythm: Normal rate and regular rhythm.     Pulses: Intact distal pulses.          Carotid pulses are 2+ on the right side and 2+ on the left side.      Radial pulses are 2+ on the right side and 2+ on the left side.       Popliteal pulses are 2+ on the right side and 2+ on the left side.       Dorsalis pedis pulses are 2+ on the right side and 2+ on the left side.       Posterior tibial pulses are 2+ on the right side and 2+ on the left side.     Heart sounds: S1 normal and S2 normal. No murmur heard.   No gallop.  Pulmonary:     Effort: Pulmonary effort is normal. No respiratory distress.     Breath sounds: No wheezing, rhonchi or rales.  Abdominal:     General: Bowel sounds are normal.  Musculoskeletal:     Right lower leg: No edema.     Left lower leg: No edema.  Neurological:     Mental Status: She is alert.    Laboratory examination:   Recent Labs    09/23/20 1621  NA 140  K 3.8  CL 106  CO2 23  GLUCOSE 273*  BUN 12  CREATININE 0.66  CALCIUM 9.4  GFRNONAA >60   CrCl cannot be calculated (Patient's most recent lab result is older than the maximum 21 days allowed.).  CMP Latest Ref Rng & Units 09/23/2020 10/14/2015 11/03/2013  Glucose 70 - 99 mg/dL 273(H) 131(H) 143(H)  BUN 6 - 20 mg/dL 12 11 13   Creatinine 0.44 - 1.00 mg/dL 0.66 0.54 0.64  Sodium 135 - 145 mmol/L 140 140 138  Potassium 3.5 - 5.1 mmol/L 3.8 3.8 4.2  Chloride 98 - 111 mmol/L 106 107 99  CO2 22 - 32 mmol/L 23 23 27   Calcium 8.9 - 10.3 mg/dL 9.4 9.4 9.6  Total Protein 6.5 - 8.1 g/dL 6.9 6.8 -  Total Bilirubin 0.3 - 1.2 mg/dL 0.7 0.7 -  Alkaline Phos 38 - 126 U/L 79 81 -  AST 15 - 41 U/L 24 23 -  ALT 0 - 44 U/L 29 19 -   CBC Latest Ref Rng & Units 09/23/2020 10/14/2015 11/10/2013  WBC 4.0 - 10.5 K/uL 6.1 11.3(H) 8.7  Hemoglobin 12.0 - 15.0 g/dL 10.6(L) 12.2 7.6(L)  Hematocrit 36.0 - 46.0 % 34.5(L) 37.6 25.1(L)  Platelets 150  - 400 K/uL 234 209 194    Lipid Panel No results for input(s): CHOL, TRIG, LDLCALC, VLDL, HDL, CHOLHDL, LDLDIRECT in the last 8760 hours.  HEMOGLOBIN A1C Lab Results  Component Value Date   HGBA1C 6.2 01/18/2010   MPG 151 (H) 10/05/2009   TSH No results for input(s): TSH in the last 8760 hours.  External labs:  09/27/2020: Hemoglobin 11.3, hematocrit 36.0, MCV 72.1, platelet  225 A1c 8.9% Sodium 139, potassium 3.5, BUN 21, creatinine 0.74, GFR >90, AST 19, ALT 24 Total cholesterol 176, triglycerides 177, LDL 106, HDL 39  Allergies   Allergies  Allergen Reactions   Metformin And Related     Medications Prior to Visit:   Outpatient Medications Prior to Visit  Medication Sig Dispense Refill   acetaminophen (TYLENOL) 500 MG tablet Take 1,000 mg by mouth every 6 (six) hours as needed (pain).     atenolol (TENORMIN) 25 MG tablet Take by mouth daily.     empagliflozin (JARDIANCE) 25 MG TABS tablet Take by mouth daily.     insulin degludec (TRESIBA FLEXTOUCH) 100 UNIT/ML FlexTouch Pen Inject 30 Units into the skin daily.     lisinopril-hydrochlorothiazide (PRINZIDE,ZESTORETIC) 20-25 MG per tablet Take 1 tablet by mouth at bedtime.      pioglitazone (ACTOS) 30 MG tablet Take 30 mg by mouth daily.     rosuvastatin (CRESTOR) 20 MG tablet Take 20 mg by mouth daily.     Semaglutide,0.25 or 0.5MG /DOS, (OZEMPIC, 0.25 OR 0.5 MG/DOSE,) 2 MG/1.5ML SOPN Inject 0.25 mg into the skin once a week.     oxyCODONE-acetaminophen (PERCOCET) 7.5-325 MG per tablet Take 1 tablet by mouth every 4 (four) hours as needed for pain. (Patient not taking: Reported on 10/14/2015) 30 tablet 0   meloxicam (MOBIC) 7.5 MG tablet Take 1 tablet (7.5 mg total) by mouth daily. (Patient not taking: Reported on 12/28/2020) 30 tablet 0   metFORMIN (GLUCOPHAGE) 1000 MG tablet Take 1,000 mg by mouth 2 (two) times daily with a meal.     Multiple Vitamin (MULTIVITAMIN WITH MINERALS) TABS tablet Take 1 tablet by mouth daily.      oxyCODONE-acetaminophen (PERCOCET) 7.5-325 MG per tablet Take 1 tablet by mouth every 4 (four) hours as needed for pain. (Patient not taking: Reported on 10/14/2015) 30 tablet 0   pimecrolimus (ELIDEL) 1 % cream Apply 1 application topically 2 (two) times daily. For Vitiligo     traMADol (ULTRAM) 50 MG tablet Take 1 tablet (50 mg total) by mouth every 6 (six) hours as needed. 15 tablet 0   No facility-administered medications prior to visit.   Final Medications at End of Visit    Current Meds  Medication Sig   acetaminophen (TYLENOL) 500 MG tablet Take 1,000 mg by mouth every 6 (six) hours as needed (pain).   atenolol (TENORMIN) 25 MG tablet Take by mouth daily.   empagliflozin (JARDIANCE) 25 MG TABS tablet Take by mouth daily.   insulin degludec (TRESIBA FLEXTOUCH) 100 UNIT/ML FlexTouch Pen Inject 30 Units into the skin daily.   lisinopril-hydrochlorothiazide (PRINZIDE,ZESTORETIC) 20-25 MG per tablet Take 1 tablet by mouth at bedtime.    pioglitazone (ACTOS) 30 MG tablet Take 30 mg by mouth daily.   rosuvastatin (CRESTOR) 20 MG tablet Take 20 mg by mouth daily.   Semaglutide,0.25 or 0.5MG /DOS, (OZEMPIC, 0.25 OR 0.5 MG/DOSE,) 2 MG/1.5ML SOPN Inject 0.25 mg into the skin once a week.   Radiology:   No results found.  Cardiac Studies:   None   EKG:   12/28/2020: Sinus rhythm at a rate of 82 bpm.  Normal axis.  No evidence of ischemia or underlying injury pattern.  Low voltage complexes.  Assessment     ICD-10-CM   1. Angina pectoris (HCC)  I20.9 EKG 12-Lead    PCV MYOCARDIAL PERFUSION WITH LEXISCAN    PCV ECHOCARDIOGRAM COMPLETE    2. Fatigue, unspecified type  R53.83 PCV MYOCARDIAL PERFUSION WITH  LEXISCAN    PCV ECHOCARDIOGRAM COMPLETE    3. Type 2 diabetes mellitus without complication, with long-term current use of insulin (HCC)  E11.9    Z79.4     4. Hypercholesterolemia  E78.00     5. Essential hypertension  I10        Medications Discontinued During This Encounter   Medication Reason   meloxicam (MOBIC) 7.5 MG tablet    metFORMIN (GLUCOPHAGE) 1000 MG tablet    Multiple Vitamin (MULTIVITAMIN WITH MINERALS) TABS tablet    oxyCODONE-acetaminophen (PERCOCET) 7.5-325 MG per tablet    pimecrolimus (ELIDEL) 1 % cream    traMADol (ULTRAM) 50 MG tablet     No orders of the defined types were placed in this encounter.   Recommendations:   Andrea Mclaughlin is a 55 y.o.  Hispanic female with history of hypertension, hyperlipidemia, uncontrolled diabetes mellitus.  She was referred to our office by Dr. Carol Ada as he has recommended patient undergo colonoscopy given concerns of unintentional weight loss and anemia.  Dr. Benson Norway has recommended colorectal cancer screening, however prior to colonoscopy she has been referred to our office for evaluation of exertional chest pain and fatigue.  Patient is accompanied by her daughter at today's office visit who acts as Optometrist.  Patient symptoms are concerning for angina pectoris.  Given patient's multiple cardiovascular risk factors including hypertension, hyperlipidemia, and uncontrolled diabetes recommend further cardiac work-up prior to colonoscopy.  Shared decision was to proceed with echocardiogram and nuclear stress test.  Patient is not a treadmill candidate due to significant knee and ankle pain, will therefore proceed with pharmacological stress test.  If echocardiogram and stress test are without significant abnormalities patient may proceed with relatively low risk from a cardiovascular standpoint and undergo colonoscopy.  However further recommendations pending results of cardiac testing.  I have notified Dr. Ulyses Amor office of recommendations to complete echocardiogram and stress test prior to patient undergoing colonoscopy.  Follow-up after cardiac testing.    Alethia Berthold, PA-C 12/28/2020, 2:33 PM Office: [phone]

## 2020-12-29 ENCOUNTER — Ambulatory Visit: Payer: BC Managed Care – PPO

## 2020-12-29 DIAGNOSIS — I209 Angina pectoris, unspecified: Secondary | ICD-10-CM

## 2020-12-29 DIAGNOSIS — R5383 Other fatigue: Secondary | ICD-10-CM

## 2021-01-03 ENCOUNTER — Other Ambulatory Visit: Payer: Self-pay

## 2021-01-03 ENCOUNTER — Ambulatory Visit: Payer: BC Managed Care – PPO

## 2021-01-03 DIAGNOSIS — I209 Angina pectoris, unspecified: Secondary | ICD-10-CM | POA: Diagnosis not present

## 2021-01-03 DIAGNOSIS — R5383 Other fatigue: Secondary | ICD-10-CM

## 2021-01-03 NOTE — Progress Notes (Signed)
Normal echocardiogram, will discuss further at upcoming office visit

## 2021-01-03 NOTE — Progress Notes (Signed)
Called pt, daughter answered and was to inform her about her echo results. Pt daughter understood

## 2021-01-05 ENCOUNTER — Encounter: Payer: Self-pay | Admitting: Student

## 2021-01-05 NOTE — Progress Notes (Signed)
Spoke with daughter, she will contact Dr. Minerva Areola office.

## 2021-01-09 ENCOUNTER — Ambulatory Visit: Payer: BC Managed Care – PPO | Admitting: Student

## 2021-01-09 ENCOUNTER — Other Ambulatory Visit: Payer: Self-pay

## 2021-01-09 ENCOUNTER — Encounter: Payer: Self-pay | Admitting: Student

## 2021-01-09 VITALS — BP 136/73 | HR 80 | Temp 98.0°F | Ht 61.5 in | Wt 132.0 lb

## 2021-01-09 DIAGNOSIS — E7800 Pure hypercholesterolemia, unspecified: Secondary | ICD-10-CM

## 2021-01-09 DIAGNOSIS — I209 Angina pectoris, unspecified: Secondary | ICD-10-CM

## 2021-01-09 DIAGNOSIS — R197 Diarrhea, unspecified: Secondary | ICD-10-CM | POA: Diagnosis not present

## 2021-01-09 DIAGNOSIS — E1165 Type 2 diabetes mellitus with hyperglycemia: Secondary | ICD-10-CM | POA: Diagnosis not present

## 2021-01-09 DIAGNOSIS — I10 Essential (primary) hypertension: Secondary | ICD-10-CM

## 2021-01-09 DIAGNOSIS — D509 Iron deficiency anemia, unspecified: Secondary | ICD-10-CM | POA: Diagnosis not present

## 2021-01-09 NOTE — Progress Notes (Signed)
Primary Physician/Referring:  Doreatha Lew, MD  Patient ID: Andrea Mclaughlin, female    DOB: 1965/04/19, 55 y.o.   MRN: [id number]  Chief Complaint  Patient presents with   Chest Pain   Follow-up   Results   HPI:    Andrea Mclaughlin  is a 55 y.o. Hispanic female with history of hypertension, hyperlipidemia, uncontrolled diabetes mellitus.  She was referred to our office by Dr. Carol Ada as he has recommended patient undergo colonoscopy given concerns of unintentional weight loss and anemia.  Dr. Benson Norway has recommended colorectal cancer screening, however prior to colonoscopy she has been referred to our office for evaluation of exertional chest pain and fatigue.  Patient is accompanied by her daughter at today's office visit who acts as Optometrist.  Patient presents for 3-week follow-up for results of cardiac testing.  At last office visit given symptoms concerning for angina pectoris patient underwent stress test and echocardiogram.  Stress test was overall low risk and echocardiogram without significant abnormalities.  Patient was therefore advised that she may proceed with colonoscopy with low risk for cardiovascular standpoint.  Patient reports she has had no recurrence of chest pain or palpitations since last office visit.  She is feeling well overall.  Past Medical History:  Diagnosis Date   Aching headache    Anemia    Complication of anesthesia    Diabetes mellitus    Hyperlipidemia    Hypertension    Spinal headache    Past Surgical History:  Procedure Laterality Date   ABDOMINAL HYSTERECTOMY N/A 11/09/2013   Procedure: HYSTERECTOMY ABDOMINAL;  Surgeon: Darlyn Chamber, MD;  Location: [facility];  Service: Gynecology;  Laterality: N/A;   APPENDECTOMY     BILATERAL SALPINGECTOMY Bilateral 11/09/2013   Procedure: BILATERAL SALPINGECTOMY;  Surgeon: Darlyn Chamber, MD;  Location: [facility];  Service: Gynecology;  Laterality: Bilateral;   CESAREAN SECTION      DILITATION & CURRETTAGE/HYSTROSCOPY WITH HYDROTHERMAL ABLATION N/A 09/16/2013   Procedure: DILATATION & CURETTAGE/HYSTEROSCOPY WITH HYDROTHERMAL ABLATION;  Surgeon: Darlyn Chamber, MD;  Location: [facility];  Service: Gynecology;  Laterality: N/A;  Failed HTA   DILITATION & CURRETTAGE/HYSTROSCOPY WITH THERMACHOICE ABLATION N/A 09/16/2013   Procedure: DILATATION & CURETTAGE/HYSTEROSCOPY WITH THERMACHOICE ABLATION;  Surgeon: Darlyn Chamber, MD;  Location: [facility];  Service: Gynecology;  Laterality: N/A;   LAPAROSCOPY N/A 11/09/2013   Procedure: LAPAROSCOPY DIAGNOSTIC;  Surgeon: Darlyn Chamber, MD;  Location: [facility];  Service: Gynecology;  Laterality: N/A;   OOPHORECTOMY Right 11/09/2013   Procedure: OOPHORECTOMY;  Surgeon: Darlyn Chamber, MD;  Location: [facility];  Service: Gynecology;  Laterality: Right;   Family History  Problem Relation Age of Onset   Hypertension Mother    Lung disease Father    Aneurysm Sister    Brain cancer Brother    Anesthesia problems Neg Hx     Social History   Tobacco Use   Smoking status: Never   Smokeless tobacco: Never  Substance Use Topics   Alcohol use: No   Marital Status: Single   ROS  Review of Systems  Constitutional: Negative for malaise/fatigue and weight gain.  Cardiovascular:  Negative for chest pain (resolved), claudication, leg swelling, near-syncope, orthopnea, palpitations (resolved), paroxysmal nocturnal dyspnea and syncope.  Respiratory:  Negative for shortness of breath.   Neurological:  Negative for dizziness.   Objective  Blood pressure 136/73, pulse 80, temperature 98 F (36.7 C), temperature source Temporal, height 5' 1.5" (  1.562 m), weight 132 lb (59.9 kg), last menstrual period 08/21/2013, SpO2 99 %.  Vitals with BMI 01/09/2021 12/28/2020 09/23/2020  Height 5' 1.5" 5' 1.2" -  Weight 132 lbs 136 lbs -  BMI 81.15 72.62 -  Systolic 035 597 416  Diastolic 73 78 93  Pulse 80 81 89      Physical Exam Vitals reviewed.  Cardiovascular:      Rate and Rhythm: Normal rate and regular rhythm.     Pulses: Intact distal pulses.          Carotid pulses are 2+ on the right side and 2+ on the left side.      Radial pulses are 2+ on the right side and 2+ on the left side.       Popliteal pulses are 2+ on the right side and 2+ on the left side.       Dorsalis pedis pulses are 2+ on the right side and 2+ on the left side.       Posterior tibial pulses are 2+ on the right side and 2+ on the left side.     Heart sounds: S1 normal and S2 normal. No murmur heard.   No gallop.  Pulmonary:     Effort: Pulmonary effort is normal. No respiratory distress.     Breath sounds: No wheezing, rhonchi or rales.  Musculoskeletal:     Right lower leg: No edema.     Left lower leg: No edema.  Neurological:     Mental Status: She is alert.  Physical exam unchanged compared to last office visit  Laboratory examination:   Recent Labs    09/23/20 1621  NA 140  K 3.8  CL 106  CO2 23  GLUCOSE 273*  BUN 12  CREATININE 0.66  CALCIUM 9.4  GFRNONAA >60   CrCl cannot be calculated (Patient's most recent lab result is older than the maximum 21 days allowed.).  CMP Latest Ref Rng & Units 09/23/2020 10/14/2015 11/03/2013  Glucose 70 - 99 mg/dL 273(H) 131(H) 143(H)  BUN 6 - 20 mg/dL 12 11 13   Creatinine 0.44 - 1.00 mg/dL 0.66 0.54 0.64  Sodium 135 - 145 mmol/L 140 140 138  Potassium 3.5 - 5.1 mmol/L 3.8 3.8 4.2  Chloride 98 - 111 mmol/L 106 107 99  CO2 22 - 32 mmol/L 23 23 27   Calcium 8.9 - 10.3 mg/dL 9.4 9.4 9.6  Total Protein 6.5 - 8.1 g/dL 6.9 6.8 -  Total Bilirubin 0.3 - 1.2 mg/dL 0.7 0.7 -  Alkaline Phos 38 - 126 U/L 79 81 -  AST 15 - 41 U/L 24 23 -  ALT 0 - 44 U/L 29 19 -   CBC Latest Ref Rng & Units 09/23/2020 10/14/2015 11/10/2013  WBC 4.0 - 10.5 K/uL 6.1 11.3(H) 8.7  Hemoglobin 12.0 - 15.0 g/dL 10.6(L) 12.2 7.6(L)  Hematocrit 36.0 - 46.0 % 34.5(L) 37.6 25.1(L)  Platelets 150 - 400 K/uL 234 209 194    Lipid Panel No results for  input(s): CHOL, TRIG, LDLCALC, VLDL, HDL, CHOLHDL, LDLDIRECT in the last 8760 hours.  HEMOGLOBIN A1C Lab Results  Component Value Date   HGBA1C 6.2 01/18/2010   MPG 151 (H) 10/05/2009   TSH No results for input(s): TSH in the last 8760 hours.  External labs:  09/27/2020: Hemoglobin 11.3, hematocrit 36.0, MCV 72.1, platelet 225 A1c 8.9% Sodium 139, potassium 3.5, BUN 21, creatinine 0.74, GFR >90, AST 19, ALT 24 Total cholesterol 176, triglycerides 177, LDL 106,  HDL 39  Allergies   Allergies  Allergen Reactions   Metformin And Related     Medications Prior to Visit:   Outpatient Medications Prior to Visit  Medication Sig Dispense Refill   acetaminophen (TYLENOL) 500 MG tablet Take 1,000 mg by mouth every 6 (six) hours as needed (pain).     atenolol (TENORMIN) 25 MG tablet Take by mouth daily.     empagliflozin (JARDIANCE) 25 MG TABS tablet Take by mouth daily.     insulin degludec (TRESIBA FLEXTOUCH) 100 UNIT/ML FlexTouch Pen Inject 30 Units into the skin daily.     lisinopril-hydrochlorothiazide (PRINZIDE,ZESTORETIC) 20-25 MG per tablet Take 1 tablet by mouth at bedtime.      oxyCODONE-acetaminophen (PERCOCET) 7.5-325 MG per tablet Take 1 tablet by mouth every 4 (four) hours as needed for pain. 30 tablet 0   pioglitazone (ACTOS) 30 MG tablet Take 30 mg by mouth daily.     rosuvastatin (CRESTOR) 20 MG tablet Take 20 mg by mouth daily.     Semaglutide,0.25 or 0.5MG /DOS, (OZEMPIC, 0.25 OR 0.5 MG/DOSE,) 2 MG/1.5ML SOPN Inject 0.25 mg into the skin once a week.     No facility-administered medications prior to visit.   Final Medications at End of Visit    Current Meds  Medication Sig   acetaminophen (TYLENOL) 500 MG tablet Take 1,000 mg by mouth every 6 (six) hours as needed (pain).   atenolol (TENORMIN) 25 MG tablet Take by mouth daily.   empagliflozin (JARDIANCE) 25 MG TABS tablet Take by mouth daily.   insulin degludec (TRESIBA FLEXTOUCH) 100 UNIT/ML FlexTouch Pen Inject 30  Units into the skin daily.   lisinopril-hydrochlorothiazide (PRINZIDE,ZESTORETIC) 20-25 MG per tablet Take 1 tablet by mouth at bedtime.    oxyCODONE-acetaminophen (PERCOCET) 7.5-325 MG per tablet Take 1 tablet by mouth every 4 (four) hours as needed for pain.   pioglitazone (ACTOS) 30 MG tablet Take 30 mg by mouth daily.   rosuvastatin (CRESTOR) 20 MG tablet Take 20 mg by mouth daily.   Semaglutide,0.25 or 0.5MG /DOS, (OZEMPIC, 0.25 OR 0.5 MG/DOSE,) 2 MG/1.5ML SOPN Inject 0.25 mg into the skin once a week.   Radiology:   No results found.  Cardiac Studies:   PCV ECHOCARDIOGRAM COMPLETE 78/58/8502 Normal LV systolic function with EF 58%. Left ventricle cavity is normal in size. Normal global wall motion. Normal diastolic filling pattern. Calculated EF 58%. Normal study.   PCV MYOCARDIAL PERFUSION WITH LEXISCAN 01/03/2021 Lexiscan (Walking with mod Bruce)Tetrofosmin Stress Test: 1 Day Rest/Stress Protocol. Stress ECG negative for ischemia. Normal myocardial perfusion without evidence of reversible myocardial ischemia or prior infarct. Left ventricular size normal, wall thickness preserved, no regional wall motion abnormalities. Calculated LVEF 79%, visually appears hyperdynamic. No prior studies available for comparison. Low risk study.  EKG:   12/28/2020: Sinus rhythm at a rate of 82 bpm.  Normal axis.  No evidence of ischemia or underlying injury pattern.  Low voltage complexes.  Assessment     ICD-10-CM   1. Angina pectoris (HCC)  I20.9     2. Essential hypertension  I10     3. Hypercholesterolemia  E78.00        There are no discontinued medications.   No orders of the defined types were placed in this encounter.   Recommendations:   Yajaira Doffing is a 55 y.o.  Hispanic female with history of hypertension, hyperlipidemia, uncontrolled diabetes mellitus.  She was referred to our office by Dr. Carol Ada as he has recommended  patient undergo colonoscopy  given concerns of unintentional weight loss and anemia.  Dr. Benson Norway has recommended colorectal cancer screening, however prior to colonoscopy she has been referred to our office for evaluation of exertional chest pain and fatigue.  Patient is accompanied by her daughter at today's office visit who acts as Optometrist.  Patient presents for 3-week follow-up for results of cardiac testing.  At last office visit given symptoms concerning for angina pectoris patient underwent stress test and echocardiogram.  Stress test was overall low risk and echocardiogram without significant abnormalities.  Patient was therefore advised that she may proceed with colonoscopy with low risk for cardiovascular standpoint.  Reviewed and discussed results of echocardiogram and stress test, details above.  Patient's questions were addressed to her satisfaction.  Patient's chest pain and palpitations have resolved since last office visit.  No further cardiac testing indicated at this time.  Recommend continued primary prevention per patient's PCP.  Cardiac restratification has been sent to Dr. Veverly Fells for upcoming colonoscopy.  Follow-up as needed.   Alethia Berthold, PA-C 01/09/2021, 2:58 PM Office: [phone]

## 2021-01-19 DIAGNOSIS — I872 Venous insufficiency (chronic) (peripheral): Secondary | ICD-10-CM | POA: Diagnosis not present

## 2021-01-19 DIAGNOSIS — E119 Type 2 diabetes mellitus without complications: Secondary | ICD-10-CM | POA: Diagnosis not present

## 2021-01-19 DIAGNOSIS — R600 Localized edema: Secondary | ICD-10-CM | POA: Diagnosis not present

## 2021-01-19 DIAGNOSIS — G629 Polyneuropathy, unspecified: Secondary | ICD-10-CM | POA: Diagnosis not present

## 2021-01-26 DIAGNOSIS — G629 Polyneuropathy, unspecified: Secondary | ICD-10-CM | POA: Diagnosis not present

## 2021-01-26 DIAGNOSIS — R600 Localized edema: Secondary | ICD-10-CM | POA: Diagnosis not present

## 2021-01-26 DIAGNOSIS — E119 Type 2 diabetes mellitus without complications: Secondary | ICD-10-CM | POA: Diagnosis not present

## 2021-01-26 DIAGNOSIS — I872 Venous insufficiency (chronic) (peripheral): Secondary | ICD-10-CM | POA: Diagnosis not present

## 2021-02-08 DIAGNOSIS — Z1211 Encounter for screening for malignant neoplasm of colon: Secondary | ICD-10-CM | POA: Diagnosis not present

## 2021-02-08 DIAGNOSIS — D509 Iron deficiency anemia, unspecified: Secondary | ICD-10-CM | POA: Diagnosis not present

## 2021-02-08 LAB — HM COLONOSCOPY

## 2021-02-13 DIAGNOSIS — G629 Polyneuropathy, unspecified: Secondary | ICD-10-CM | POA: Diagnosis not present

## 2021-02-13 DIAGNOSIS — I872 Venous insufficiency (chronic) (peripheral): Secondary | ICD-10-CM | POA: Diagnosis not present

## 2021-02-13 DIAGNOSIS — R600 Localized edema: Secondary | ICD-10-CM | POA: Diagnosis not present

## 2021-02-13 DIAGNOSIS — E119 Type 2 diabetes mellitus without complications: Secondary | ICD-10-CM | POA: Diagnosis not present

## 2021-02-20 DIAGNOSIS — E119 Type 2 diabetes mellitus without complications: Secondary | ICD-10-CM | POA: Diagnosis not present

## 2021-02-20 DIAGNOSIS — Z794 Long term (current) use of insulin: Secondary | ICD-10-CM | POA: Diagnosis not present

## 2021-02-20 DIAGNOSIS — E781 Pure hyperglyceridemia: Secondary | ICD-10-CM | POA: Diagnosis not present

## 2021-02-20 DIAGNOSIS — Z7984 Long term (current) use of oral hypoglycemic drugs: Secondary | ICD-10-CM | POA: Diagnosis not present

## 2021-02-20 DIAGNOSIS — I10 Essential (primary) hypertension: Secondary | ICD-10-CM | POA: Diagnosis not present

## 2021-02-20 DIAGNOSIS — Z7985 Long-term (current) use of injectable non-insulin antidiabetic drugs: Secondary | ICD-10-CM | POA: Diagnosis not present

## 2021-03-17 ENCOUNTER — Encounter (HOSPITAL_COMMUNITY): Payer: BC Managed Care – PPO

## 2021-03-17 ENCOUNTER — Encounter: Payer: BC Managed Care – PPO | Admitting: Vascular Surgery

## 2021-03-20 ENCOUNTER — Other Ambulatory Visit: Payer: Self-pay

## 2021-03-20 DIAGNOSIS — M79669 Pain in unspecified lower leg: Secondary | ICD-10-CM

## 2021-03-23 NOTE — Progress Notes (Signed)
Office Note     CC: Bilateral upper and lower extremity swelling with pain Requesting Provider:  Patrecia Pour, Christean Grief, MD  HPI: Andrea Mclaughlin is a 56 y.o. (Jun 02, 1965) female who presents at the request of Patrecia Pour, Christean Grief, MD for evaluation of bilateral lower extremity swelling with pain, concern for chronic venous insufficiency.  On exam, Keon was accompanied by her daughter.  She has a 1 year history of extremity swelling initially beginning in her hands wrists and forearms that then involved bilateral lower extremities.  She has seen rheumatology, had malignancy work-up, and has been placed on multiple medications in an effort to treat and ease pain in her extremities. Work-up has included breast exam  and colonoscopy to rule out malignancy.    At this time, there is no diagnosis.  Lakara's upper extremities appear to be more bothersome than her lower extremities.  In the hands, she is unable to flex the fingers to the point she is having difficulty opening jars and lids.  Denies wounds.  The pain is worse by days end when she appreciates more swelling, but she continues to have pain even in the mornings.   The pt  on a statin for cholesterol management.  The pt not on a daily aspirin.   Other AC:   The pt is on medications for hypertension.   The pt is diabetic.   Tobacco hx:  -  Past Medical History:  Diagnosis Date   Aching headache    Anemia    Complication of anesthesia    Diabetes mellitus    Hyperlipidemia    Hypertension    Spinal headache     Past Surgical History:  Procedure Laterality Date   ABDOMINAL HYSTERECTOMY N/A 11/09/2013   Procedure: HYSTERECTOMY ABDOMINAL;  Surgeon: Darlyn Chamber, MD;  Location: [facility];  Service: Gynecology;  Laterality: N/A;   APPENDECTOMY     BILATERAL SALPINGECTOMY Bilateral 11/09/2013   Procedure: BILATERAL SALPINGECTOMY;  Surgeon: Darlyn Chamber, MD;  Location: [facility];  Service: Gynecology;  Laterality: Bilateral;   CESAREAN SECTION      DILITATION & CURRETTAGE/HYSTROSCOPY WITH HYDROTHERMAL ABLATION N/A 09/16/2013   Procedure: DILATATION & CURETTAGE/HYSTEROSCOPY WITH HYDROTHERMAL ABLATION;  Surgeon: Darlyn Chamber, MD;  Location: [facility];  Service: Gynecology;  Laterality: N/A;  Failed HTA   DILITATION & CURRETTAGE/HYSTROSCOPY WITH THERMACHOICE ABLATION N/A 09/16/2013   Procedure: DILATATION & CURETTAGE/HYSTEROSCOPY WITH THERMACHOICE ABLATION;  Surgeon: Darlyn Chamber, MD;  Location: [facility];  Service: Gynecology;  Laterality: N/A;   LAPAROSCOPY N/A 11/09/2013   Procedure: LAPAROSCOPY DIAGNOSTIC;  Surgeon: Darlyn Chamber, MD;  Location: [facility];  Service: Gynecology;  Laterality: N/A;   OOPHORECTOMY Right 11/09/2013   Procedure: OOPHORECTOMY;  Surgeon: Darlyn Chamber, MD;  Location: [facility];  Service: Gynecology;  Laterality: Right;    Social History   Socioeconomic History   Marital status: Single    Spouse name: Not on file   Number of children: Not on file   Years of education: Not on file   Highest education level: Not on file  Occupational History   Not on file  Tobacco Use   Smoking status: Never   Smokeless tobacco: Never  Substance and Sexual Activity   Alcohol use: No   Drug use: No   Sexual activity: Yes  Other Topics Concern   Not on file  Social History Narrative   Not on file   Social Determinants of Health   Financial Resource Strain: Not  on file  Food Insecurity: Not on file  Transportation Needs: Not on file  Physical Activity: Not on file  Stress: Not on file  Social Connections: Not on file  Intimate Partner Violence: Not on file    Family History  Problem Relation Age of Onset   Hypertension Mother    Lung disease Father    Aneurysm Sister    Brain cancer Brother    Anesthesia problems Neg Hx     Current Outpatient Medications  Medication Sig Dispense Refill   acetaminophen (TYLENOL) 500 MG tablet Take 1,000 mg by mouth every 6 (six) hours as needed (pain).     atenolol (TENORMIN) 25  MG tablet Take by mouth daily.     empagliflozin (JARDIANCE) 25 MG TABS tablet Take by mouth daily.     insulin degludec (TRESIBA FLEXTOUCH) 100 UNIT/ML FlexTouch Pen Inject 30 Units into the skin daily.     lisinopril-hydrochlorothiazide (PRINZIDE,ZESTORETIC) 20-25 MG per tablet Take 1 tablet by mouth at bedtime.      oxyCODONE-acetaminophen (PERCOCET) 7.5-325 MG per tablet Take 1 tablet by mouth every 4 (four) hours as needed for pain. 30 tablet 0   pioglitazone (ACTOS) 30 MG tablet Take 30 mg by mouth daily.     rosuvastatin (CRESTOR) 20 MG tablet Take 20 mg by mouth daily.     Semaglutide,0.25 or 0.5MG /DOS, (OZEMPIC, 0.25 OR 0.5 MG/DOSE,) 2 MG/1.5ML SOPN Inject 0.25 mg into the skin once a week.     No current facility-administered medications for this visit.    Allergies  Allergen Reactions   Metformin And Related      REVIEW OF SYSTEMS:   [X]  denotes positive finding, [ ]  denotes negative finding Cardiac  Comments:  Chest pain or chest pressure:    Shortness of breath upon exertion:    Short of breath when lying flat:    Irregular heart rhythm:        Vascular    Pain in calf, thigh, or hip brought on by ambulation:    Pain in feet at night that wakes you up from your sleep:     Blood clot in your veins:    Leg swelling:         Pulmonary    Oxygen at home:    Productive cough:     Wheezing:         Neurologic    Sudden weakness in arms or legs:     Sudden numbness in arms or legs:     Sudden onset of difficulty speaking or slurred speech:    Temporary loss of vision in one eye:     Problems with dizziness:         Gastrointestinal    Blood in stool:     Vomited blood:         Genitourinary    Burning when urinating:     Blood in urine:        Psychiatric    Major depression:         Hematologic    Bleeding problems:    Problems with blood clotting too easily:        Skin    Rashes or ulcers:        Constitutional    Fever or chills:       PHYSICAL EXAMINATION:  There were no vitals filed for this visit.  General:  WDWN in NAD; vital signs documented above Gait: Not observed HENT: WNL, normocephalic Pulmonary: normal non-labored  breathing , without Rales, rhonchi,  wheezing Cardiac: regular HR Abdomen: soft, NT, no masses Skin: without rashes Vascular Exam/Pulses:  Right Left  Radial 2+ (normal) 2+ (normal)  Ulnar 2+ (normal) 2+ (normal)  Femoral    Popliteal    DP 2+ (normal) 2+ (normal)  PT 2+ (normal) 2+ (normal)   Extremities: without ischemic changes, without Gangrene , without cellulitis; without open wounds;  Patient has edema in the fingers, hands, forearms bilaterally, toes, ankles. Musculoskeletal: no muscle wasting or atrophy  Neurologic: A&O X 3;  No focal weakness or paresthesias are detected Psychiatric:  The pt has Normal affect.   Non-Invasive Vascular Imaging:   Venous Reflux Times  +--------------+---------+------+-----------+------------+--------+   RIGHT          Reflux No Reflux Reflux Time Diameter cms Comments                              Yes                                       +--------------+---------+------+-----------+------------+--------+   CFV            no                                                   +--------------+---------+------+-----------+------------+--------+   FV prox        no                                                   +--------------+---------+------+-----------+------------+--------+   FV mid         no                                                   +--------------+---------+------+-----------+------------+--------+   GSV at SFJ                yes     >500 ms      0.691                +--------------+---------+------+-----------+------------+--------+   GSV prox thigh no                              0.349                +--------------+---------+------+-----------+------------+--------+   GSV mid thigh  no                              0.349                 +--------------+---------+------+-----------+------------+--------+   GSV dist thigh no                              0.355                +--------------+---------+------+-----------+------------+--------+  GSV at knee    no                              0.287                +--------------+---------+------+-----------+------------+--------+   GSV prox calf  no                              0.207                +--------------+---------+------+-----------+------------+--------+   SSV Pop Fossa  no                              0.247                +--------------+---------+------+-----------+------------+--------+   SSV prox calf  no                              0.155                +--------------+---------+------+-----------+------------+--------+   SSV mid calf   no                              0.165                +--------------+---------+------+-----------+------------+--------+     ASSESSMENT/PLAN:: 56 y.o. female presenting with bilateral upper and lower extremity idiopathic pain and edema.  Imaging was reviewed demonstrating no venous insufficiency in the right leg. There is some reflux at the SFJ, CFV confluence. Waveforms were normal with no pulsatility. CT abdomen pelvis from 2017 was also reviewed demonstrating no central lesions that would cause bilateral lower extremity swelling.  On my exam, upper extremity swelling was more apparent, especially in the hands fingers and forearms.  There was associated warmth and tenderness at the joints.  No erythema, but notable edema.  Distribution of swelling is not consistent with lymphedema.  I am unsure as to the etiology of her bilateral upper and lower extremity pain and edema.  It would be beneficial for her to undergo CT venogram of the chest to ensure there are no central lesions that could create venous hypertension.  Even if this were the case, it does not explain the lower extremity edema in the ankles and feet.  Interestingly the thighs and upper arms are without pain bilaterally.   I will call her with the results the study.  I recommended she return to her rheumatologist due to the distribution of the swelling and localization to the forearms, wrist, fingers, ankles and toes.   Broadus John, MD [facility] [phone]

## 2021-03-24 ENCOUNTER — Encounter: Payer: Self-pay | Admitting: Vascular Surgery

## 2021-03-24 ENCOUNTER — Ambulatory Visit: Payer: BC Managed Care – PPO | Admitting: Vascular Surgery

## 2021-03-24 ENCOUNTER — Other Ambulatory Visit: Payer: Self-pay

## 2021-03-24 ENCOUNTER — Ambulatory Visit (HOSPITAL_COMMUNITY)
Admission: RE | Admit: 2021-03-24 | Discharge: 2021-03-24 | Disposition: A | Discharge status: Home / Self Care | Payer: BC Managed Care – PPO | Source: Ambulatory Visit | Attending: Vascular Surgery | Admitting: Vascular Surgery

## 2021-03-24 VITALS — BP 150/89 | HR 87 | Temp 98.6°F | Resp 20 | Ht 61.5 in | Wt 140.0 lb

## 2021-03-24 DIAGNOSIS — M79669 Pain in unspecified lower leg: Secondary | ICD-10-CM | POA: Diagnosis not present

## 2021-03-24 DIAGNOSIS — M7989 Other specified soft tissue disorders: Secondary | ICD-10-CM | POA: Diagnosis not present

## 2021-03-24 DIAGNOSIS — M79606 Pain in leg, unspecified: Secondary | ICD-10-CM | POA: Diagnosis not present

## 2021-03-24 DIAGNOSIS — M79661 Pain in right lower leg: Secondary | ICD-10-CM | POA: Diagnosis not present

## 2021-03-24 DIAGNOSIS — M79603 Pain in arm, unspecified: Secondary | ICD-10-CM | POA: Diagnosis not present

## 2021-04-05 ENCOUNTER — Other Ambulatory Visit: Payer: Self-pay

## 2021-04-05 DIAGNOSIS — R0989 Other specified symptoms and signs involving the circulatory and respiratory systems: Secondary | ICD-10-CM

## 2021-04-07 ENCOUNTER — Emergency Department (HOSPITAL_COMMUNITY)
Admission: EM | Admit: 2021-04-07 | Discharge: 2021-04-08 | Disposition: A | Discharge status: Home / Self Care | Payer: BC Managed Care – PPO | Attending: Emergency Medicine | Admitting: Emergency Medicine

## 2021-04-07 ENCOUNTER — Emergency Department (HOSPITAL_COMMUNITY): Payer: BC Managed Care – PPO

## 2021-04-07 ENCOUNTER — Other Ambulatory Visit: Payer: Self-pay

## 2021-04-07 DIAGNOSIS — E876 Hypokalemia: Secondary | ICD-10-CM

## 2021-04-07 DIAGNOSIS — N3289 Other specified disorders of bladder: Secondary | ICD-10-CM | POA: Diagnosis not present

## 2021-04-07 DIAGNOSIS — R1110 Vomiting, unspecified: Secondary | ICD-10-CM | POA: Diagnosis not present

## 2021-04-07 DIAGNOSIS — R509 Fever, unspecified: Secondary | ICD-10-CM | POA: Insufficient documentation

## 2021-04-07 DIAGNOSIS — N390 Urinary tract infection, site not specified: Secondary | ICD-10-CM

## 2021-04-07 DIAGNOSIS — M545 Low back pain, unspecified: Secondary | ICD-10-CM | POA: Diagnosis not present

## 2021-04-07 DIAGNOSIS — Z794 Long term (current) use of insulin: Secondary | ICD-10-CM | POA: Insufficient documentation

## 2021-04-07 DIAGNOSIS — D509 Iron deficiency anemia, unspecified: Secondary | ICD-10-CM

## 2021-04-07 DIAGNOSIS — R040 Epistaxis: Secondary | ICD-10-CM | POA: Insufficient documentation

## 2021-04-07 DIAGNOSIS — Z20822 Contact with and (suspected) exposure to covid-19: Secondary | ICD-10-CM | POA: Diagnosis not present

## 2021-04-07 DIAGNOSIS — D649 Anemia, unspecified: Secondary | ICD-10-CM | POA: Insufficient documentation

## 2021-04-07 DIAGNOSIS — I10 Essential (primary) hypertension: Secondary | ICD-10-CM | POA: Diagnosis not present

## 2021-04-07 DIAGNOSIS — E119 Type 2 diabetes mellitus without complications: Secondary | ICD-10-CM | POA: Diagnosis not present

## 2021-04-07 DIAGNOSIS — M791 Myalgia, unspecified site: Secondary | ICD-10-CM | POA: Diagnosis not present

## 2021-04-07 DIAGNOSIS — R000 Tachycardia, unspecified: Secondary | ICD-10-CM | POA: Insufficient documentation

## 2021-04-07 DIAGNOSIS — Z79899 Other long term (current) drug therapy: Secondary | ICD-10-CM | POA: Insufficient documentation

## 2021-04-07 DIAGNOSIS — R0689 Other abnormalities of breathing: Secondary | ICD-10-CM | POA: Diagnosis not present

## 2021-04-07 DIAGNOSIS — R300 Dysuria: Secondary | ICD-10-CM | POA: Diagnosis not present

## 2021-04-07 DIAGNOSIS — R42 Dizziness and giddiness: Secondary | ICD-10-CM | POA: Diagnosis not present

## 2021-04-07 LAB — URINALYSIS, ROUTINE W REFLEX MICROSCOPIC
Bilirubin Urine: NEGATIVE
Glucose, UA: >=500 mg/dL — AB
Hgb urine dipstick: NEGATIVE
Ketones, ur: NEGATIVE mg/dL
Nitrite: NEGATIVE
Protein, ur: NEGATIVE mg/dL
Specific Gravity, Urine: 1.002 — ABNORMAL LOW (ref 1.005–1.030)
pH: 6 (ref 5.0–8.0)

## 2021-04-07 LAB — RESP PANEL BY RT-PCR (FLU A&B, COVID) ARPGX2
Influenza A by PCR: NEGATIVE
Influenza B by PCR: NEGATIVE
SARS Coronavirus 2 by RT PCR: NEGATIVE

## 2021-04-07 LAB — CBC WITH DIFFERENTIAL/PLATELET
Abs Immature Granulocytes: 0.04 10*3/uL (ref 0.00–0.07)
Basophils Absolute: 0 10*3/uL (ref 0.0–0.1)
Basophils Relative: 0 %
Eosinophils Absolute: 0 10*3/uL (ref 0.0–0.5)
Eosinophils Relative: 1 %
HCT: 31.5 % — ABNORMAL LOW (ref 36.0–46.0)
Hemoglobin: 9.8 g/dL — ABNORMAL LOW (ref 12.0–15.0)
Immature Granulocytes: 1 %
Lymphocytes Relative: 14 %
Lymphs Abs: 1.1 10*3/uL (ref 0.7–4.0)
MCH: 22.1 pg — ABNORMAL LOW (ref 26.0–34.0)
MCHC: 31.1 g/dL (ref 30.0–36.0)
MCV: 71.1 fL — ABNORMAL LOW (ref 80.0–100.0)
Monocytes Absolute: 0.8 10*3/uL (ref 0.1–1.0)
Monocytes Relative: 11 %
Neutro Abs: 5.4 10*3/uL (ref 1.7–7.7)
Neutrophils Relative %: 73 %
Platelets: 222 10*3/uL (ref 150–400)
RBC: 4.43 MIL/uL (ref 3.87–5.11)
RDW: 17.2 % — ABNORMAL HIGH (ref 11.5–15.5)
WBC: 7.4 10*3/uL (ref 4.0–10.5)
nRBC: 0 % (ref 0.0–0.2)

## 2021-04-07 LAB — COMPREHENSIVE METABOLIC PANEL
ALT: 17 U/L (ref 0–44)
AST: 20 U/L (ref 15–41)
Albumin: 3.7 g/dL (ref 3.5–5.0)
Alkaline Phosphatase: 68 U/L (ref 38–126)
Anion gap: 10 (ref 5–15)
BUN: 19 mg/dL (ref 6–20)
CO2: 24 mmol/L (ref 22–32)
Calcium: 9 mg/dL (ref 8.9–10.3)
Chloride: 100 mmol/L (ref 98–111)
Creatinine, Ser: 0.74 mg/dL (ref 0.44–1.00)
GFR, Estimated: >60 mL/min (ref >60)
Glucose, Bld: 179 mg/dL — ABNORMAL HIGH (ref 70–99)
Potassium: 3 mmol/L — ABNORMAL LOW (ref 3.5–5.1)
Sodium: 134 mmol/L — ABNORMAL LOW (ref 135–145)
Total Bilirubin: 0.2 mg/dL — ABNORMAL LOW (ref 0.3–1.2)
Total Protein: 7.4 g/dL (ref 6.5–8.1)

## 2021-04-07 LAB — CK: Total CK: 54 U/L (ref 38–234)

## 2021-04-07 LAB — PROTIME-INR
INR: 1.1 (ref 0.8–1.2)
Prothrombin Time: 13.9 seconds (ref 11.4–15.2)

## 2021-04-07 LAB — LACTIC ACID, PLASMA: Lactic Acid, Venous: 1.7 mmol/L (ref 0.5–1.9)

## 2021-04-07 LAB — APTT: aPTT: 37 seconds — ABNORMAL HIGH (ref 24–36)

## 2021-04-07 MED ORDER — HYDROMORPHONE HCL 1 MG/ML IJ SOLN
1.0000 mg | Freq: Once | INTRAMUSCULAR | Status: AC
  Administered 2021-04-07: 1 mg via INTRAVENOUS
  Filled 2021-04-07: qty 1

## 2021-04-07 MED ORDER — LACTATED RINGERS IV BOLUS
1000.0000 mL | Freq: Once | INTRAVENOUS | Status: AC
  Administered 2021-04-07: 1000 mL via INTRAVENOUS

## 2021-04-07 NOTE — ED Provider Notes (Signed)
?[facility] ?Provider Note ? ? ?CSN: [id number] ?Arrival date & time: 04/07/21  2057 ? ?  ? ?History ? ?Chief Complaint  ?Patient presents with  ? Fever  ? Flank Pain  ? ? ?Andrea Mclaughlin is a 56 y.o. female. ? ?HPI ?56 year old female with a history of diabetes, hypertension presents with fever and back pain.  Started today.  Her temperature was up to 103.  She had some vomiting as well as diffuse myalgias in her extremities but mostly pain in her bilateral back.  No abdominal pain.  She does endorse some dysuria.  No cough though she felt like she had a nosebleed earlier.  No headache.  EMS gave her about 600 cc IV fluids.  No weakness or numbness in her legs, only the pain which is also in her arms.  No incontinence. ? ?Son arrived later and provides further history which includes that the patient has been on and off sick for over a year.  No one seems to know why according to him.  There is a question of maybe she has lupus.  However she has not been this severe. ? ?Home Medications ?Prior to Admission medications   ?Medication Sig Start Date End Date Taking? Authorizing Provider  ?acetaminophen (TYLENOL) 500 MG tablet Take 1,000 mg by mouth every 6 (six) hours as needed (pain).    [provider]  ?atenolol (TENORMIN) 25 MG tablet Take by mouth daily.    [provider]  ?empagliflozin (JARDIANCE) 25 MG TABS tablet Take by mouth daily.    [provider]  ?furosemide (LASIX) 20 MG tablet Take 20 mg by mouth daily as needed. 01/26/21   [provider]  ?gabapentin (NEURONTIN) 400 MG capsule SMARTSIG:1 Capsule(s) By Mouth 1-2 Times Daily 01/26/21   [provider]  ?insulin degludec (TRESIBA FLEXTOUCH) 100 UNIT/ML FlexTouch Pen Inject 30 Units into the skin daily.    [provider]  ?lisinopril-hydrochlorothiazide (PRINZIDE,ZESTORETIC) 20-25 MG per tablet Take 1 tablet by mouth at bedtime.     [provider]   ?pioglitazone (ACTOS) 30 MG tablet Take 30 mg by mouth daily.    [provider]  ?rosuvastatin (CRESTOR) 20 MG tablet Take 20 mg by mouth daily.    [provider]  ?Semaglutide,0.25 or 0.'5MG'$ /DOS, (OZEMPIC, 0.25 OR 0.5 MG/DOSE,) 2 MG/1.5ML SOPN Inject 0.25 mg into the skin once a week.    [provider]  ?sulindac (CLINORIL) 200 MG tablet Take 200 mg by mouth 2 (two) times daily. 01/26/21   [provider]  ?tiZANidine (ZANAFLEX) 4 MG tablet Take 4-8 mg by mouth every 8 (eight) hours as needed. 02/19/21   [provider]  ?   ? ?Allergies    ?Metformin and related   ? ?Review of Systems   ?Review of Systems  ?Constitutional:  Positive for fever.  ?Respiratory:  Negative for cough and shortness of breath.   ?Cardiovascular:  Negative for chest pain.  ?Gastrointestinal:  Positive for vomiting. Negative for abdominal pain.  ?Genitourinary:  Positive for dysuria.  ?Musculoskeletal:  Positive for back pain.  ?Neurological:  Negative for headaches.  ? ?Physical Exam ?Updated Vital Signs ?BP 140/68   Pulse 98   Temp 98.7 ?F (37.1 ?C) (Oral)   Resp 19   Ht 5' (1.524 m)   Wt 61.2 kg   LMP 08/21/2013   SpO2 96%   BMI 26.37 kg/m?  ?Physical Exam ?Vitals and nursing note reviewed.  ?Constitutional:   ?  Appearance: She is well-developed. She is not ill-appearing or diaphoretic.  ?HENT:  ?   Head: Normocephalic and atraumatic.  ?Cardiovascular:  ?   Rate and Rhythm: Regular rhythm. Tachycardia present.  ?   Pulses:     ?     Radial pulses are 2+ on the right side and 2+ on the left side.  ?     Dorsalis pedis pulses are 2+ on the right side and 2+ on the left side.  ?   Heart sounds: Normal heart sounds.  ?Pulmonary:  ?   Effort: Pulmonary effort is normal.  ?   Breath sounds: Normal breath sounds. No wheezing, rhonchi or rales.  ?Abdominal:  ?   General: There is no distension.  ?   Palpations: Abdomen is soft.  ?   Tenderness: There is no abdominal tenderness. There is  right CVA tenderness and left CVA tenderness.  ?Musculoskeletal:  ?   Comments: Diffuse tenderness to large muscle groups of arms and legs though no swelling.  This tenderness is mild. ?There is some mild midline lumbar back pain but her CVA tenderness is much more prominent.  ?Skin: ?   General: Skin is warm and dry.  ?Neurological:  ?   Mental Status: She is alert.  ?   Comments: 5/5 strength in BLE. Grossly normal sensation.  ? ? ?ED Results / Procedures / Treatments   ?Labs ?(all labs ordered are listed, but only abnormal results are displayed) ?Labs Reviewed  ?CBC WITH DIFFERENTIAL/PLATELET - Abnormal; Notable for the following components:  ?    Result Value  ? Hemoglobin 9.8 (*)   ? HCT 31.5 (*)   ? MCV 71.1 (*)   ? MCH 22.1 (*)   ? RDW 17.2 (*)   ? All other components within normal limits  ?APTT - Abnormal; Notable for the following components:  ? aPTT 37 (*)   ? All other components within normal limits  ?URINALYSIS, ROUTINE W REFLEX MICROSCOPIC - Abnormal; Notable for the following components:  ? Color, Urine COLORLESS (*)   ? Specific Gravity, Urine 1.002 (*)   ? Glucose, UA >=500 (*)   ? Leukocytes,Ua TRACE (*)   ? Bacteria, UA RARE (*)   ? Non Squamous Epithelial 0-5 (*)   ? All other components within normal limits  ?RESP PANEL BY RT-PCR (FLU A&B, COVID) [id number]  ?CULTURE, BLOOD (ROUTINE X 2)  ?CULTURE, BLOOD (ROUTINE X 2)  ?URINE CULTURE  ?PROTIME-INR  ?LACTIC ACID, PLASMA  ?LACTIC ACID, PLASMA  ?COMPREHENSIVE METABOLIC PANEL  ?CK  ?I-STAT BETA HCG BLOOD, ED ([facility], [facility], [facility] ONLY)  ? ? ?EKG ?EKG Interpretation ? ?Date/Time:  Friday April 07 2021 21:09:38 EST ?Ventricular Rate:  107 ?PR Interval:  132 ?QRS Duration: 90 ?QT Interval:  349 ?QTC Calculation: 466 ?R Axis:   97 ?Text Interpretation: Sinus tachycardia Borderline right axis deviation Confirmed by Pattricia Boss [phone]) on 04/07/2021 9:27:19 PM ? ?Radiology ?DG Chest Port 1 View ? ?Result Date: 04/07/2021 ?CLINICAL DATA:  Question of sepsis. Fever and  left flank pain. Body aches. EXAM: PORTABLE CHEST 1 VIEW COMPARISON:  10/14/2015 FINDINGS: Shallow inspiration. The heart size and mediastinal contours are within normal limits. Both lungs are clear. The visualized skeletal structures are unremarkable. IMPRESSION: No active disease. Electronically Signed   By: Lucienne Capers M.D.   On: 04/07/2021 21:54   ? ?Procedures ?Procedures  ? ? ?Medications Ordered in ED ?Medications  ?lactated ringers bolus 1,000 mL (0 mLs Intravenous Stopped  04/07/21 2252)  ?HYDROmorphone (DILAUDID) injection 1 mg (1 mg Intravenous Given 04/07/21 2144)  ? ? ?ED Course/ Medical Decision Making/ A&P ?  ?                        ?Medical Decision Making ?Amount and/or Complexity of Data Reviewed ?Labs: ordered. ?Radiology: ordered. ?ECG/medicine tests: ordered. ? ?Risk ?Prescription drug management. ? ? ?Patient's initial lab work has been reviewed and interpreted.  Normal white blood cell count.  Chronic anemia.  Urinalysis with some minimal leukocytes though she has been having 3 days of dysuria so I think if no other causes found would be reasonable to treat for urinary tract infection/pyelonephritis.  Otherwise, on reexamination she is having lower abdominal pain, left greater than right.  We will get a CT abdomen/pelvis.  My suspicion that she has an acute spinal cord emergency causing her back pain is pretty low.  Pain is more flank area.  No signs of neurologic compromise with normal strength/sensation in the extremities. Care transferred to Dr. Florina Ou.  ? ? ? ? ? ? ? ?Final Clinical Impression(s) / ED Diagnoses ?Final diagnoses:  ?None  ? ? ?Rx / DC Orders ?ED Discharge Orders   ? ? None  ? ?  ? ? ?  ?Sherwood Gambler, MD ?04/07/21 2302 ? ?

## 2021-04-07 NOTE — ED Notes (Signed)
Portable xray at bedside.

## 2021-04-07 NOTE — ED Triage Notes (Signed)
Patient BIB EMS for evaluation of fever and L sided flank pain.  Reports body aches.  No cough.  Has had vomiting.  Given Zofran 4 mg IV and LR 600 mL IV prior to arrival.  Symptoms started around 2 PM today ?

## 2021-04-08 ENCOUNTER — Emergency Department (HOSPITAL_COMMUNITY): Payer: BC Managed Care – PPO

## 2021-04-08 ENCOUNTER — Encounter (HOSPITAL_COMMUNITY): Payer: Self-pay

## 2021-04-08 DIAGNOSIS — N3289 Other specified disorders of bladder: Secondary | ICD-10-CM | POA: Diagnosis not present

## 2021-04-08 MED ORDER — SODIUM CHLORIDE 0.9 % IV SOLN
1.0000 g | Freq: Once | INTRAVENOUS | Status: AC
  Administered 2021-04-08: 1 g via INTRAVENOUS
  Filled 2021-04-08: qty 10

## 2021-04-08 MED ORDER — POTASSIUM CHLORIDE CRYS ER 20 MEQ PO TBCR
40.0000 meq | EXTENDED_RELEASE_TABLET | Freq: Once | ORAL | Status: AC
  Administered 2021-04-08: 40 meq via ORAL
  Filled 2021-04-08: qty 2

## 2021-04-08 MED ORDER — IOHEXOL 300 MG/ML  SOLN
100.0000 mL | Freq: Once | INTRAMUSCULAR | Status: AC | PRN
  Administered 2021-04-08: 100 mL via INTRAVENOUS

## 2021-04-08 MED ORDER — CEPHALEXIN 500 MG PO CAPS: 500.0000 mg | ORAL_CAPSULE | Freq: Four times a day (QID) | ORAL | 0 refills | Status: DC

## 2021-04-08 NOTE — ED Notes (Signed)
Patient assisted to restroom.  Steady gait noted °

## 2021-04-08 NOTE — ED Provider Notes (Signed)
Nursing notes and vitals signs, including pulse oximetry, reviewed. ? ?Summary of this visit's results, reviewed by myself: ? ?EKG: ? EKG Interpretation ? ?Date/Time:  Friday April 07 2021 21:09:38 EST ?Ventricular Rate:  107 ?PR Interval:  132 ?QRS Duration: 90 ?QT Interval:  349 ?QTC Calculation: 466 ?R Axis:   97 ?Text Interpretation: Sinus tachycardia Borderline right axis deviation Confirmed by Pattricia Boss [phone]) on 04/07/2021 9:27:19 PM ?  ? ?  ? ? ?Labs:  ?Results for orders placed or performed during the hospital encounter of 04/07/21 (from the past 24 hour(s))  ?Resp Panel by RT-PCR (Flu A&B, Covid) Nasopharyngeal Swab     Status: None  ? Collection Time: 04/07/21  9:23 PM  ? Specimen: Nasopharyngeal Swab; Nasopharyngeal(NP) swabs in vial transport medium  ?Result Value Ref Range  ? SARS Coronavirus 2 by RT PCR NEGATIVE NEGATIVE  ? Influenza A by PCR NEGATIVE NEGATIVE  ? Influenza B by PCR NEGATIVE NEGATIVE  ?Comprehensive metabolic panel     Status: Abnormal  ? Collection Time: 04/07/21  9:40 PM  ?Result Value Ref Range  ? Sodium 134 (L) 135 - 145 mmol/L  ? Potassium 3.0 (L) 3.5 - 5.1 mmol/L  ? Chloride 100 98 - 111 mmol/L  ? CO2 24 22 - 32 mmol/L  ? Glucose, Bld 179 (H) 70 - 99 mg/dL  ? BUN 19 6 - 20 mg/dL  ? Creatinine, Ser 0.74 0.44 - 1.00 mg/dL  ? Calcium 9.0 8.9 - 10.3 mg/dL  ? Total Protein 7.4 6.5 - 8.1 g/dL  ? Albumin 3.7 3.5 - 5.0 g/dL  ? AST 20 15 - 41 U/L  ? ALT 17 0 - 44 U/L  ? Alkaline Phosphatase 68 38 - 126 U/L  ? Total Bilirubin 0.2 (L) 0.3 - 1.2 mg/dL  ? GFR, Estimated >60 >60 mL/min  ? Anion gap 10 5 - 15  ?CBC WITH DIFFERENTIAL     Status: Abnormal  ? Collection Time: 04/07/21  9:40 PM  ?Result Value Ref Range  ? WBC 7.4 4.0 - 10.5 K/uL  ? RBC 4.43 3.87 - 5.11 MIL/uL  ? Hemoglobin 9.8 (L) 12.0 - 15.0 g/dL  ? HCT 31.5 (L) 36.0 - 46.0 %  ? MCV 71.1 (L) 80.0 - 100.0 fL  ? MCH 22.1 (L) 26.0 - 34.0 pg  ? MCHC 31.1 30.0 - 36.0 g/dL  ? RDW 17.2 (H) 11.5 - 15.5 %  ? Platelets 222 150 - 400  K/uL  ? nRBC 0.0 0.0 - 0.2 %  ? Neutrophils Relative % 73 %  ? Neutro Abs 5.4 1.7 - 7.7 K/uL  ? Lymphocytes Relative 14 %  ? Lymphs Abs 1.1 0.7 - 4.0 K/uL  ? Monocytes Relative 11 %  ? Monocytes Absolute 0.8 0.1 - 1.0 K/uL  ? Eosinophils Relative 1 %  ? Eosinophils Absolute 0.0 0.0 - 0.5 K/uL  ? Basophils Relative 0 %  ? Basophils Absolute 0.0 0.0 - 0.1 K/uL  ? Immature Granulocytes 1 %  ? Abs Immature Granulocytes 0.04 0.00 - 0.07 K/uL  ?Protime-INR     Status: None  ? Collection Time: 04/07/21  9:40 PM  ?Result Value Ref Range  ? Prothrombin Time 13.9 11.4 - 15.2 seconds  ? INR 1.1 0.8 - 1.2  ?APTT     Status: Abnormal  ? Collection Time: 04/07/21  9:40 PM  ?Result Value Ref Range  ? aPTT 37 (H) 24 - 36 seconds  ?CK     Status: None  ?  Collection Time: 04/07/21  9:40 PM  ?Result Value Ref Range  ? Total CK 54 38 - 234 U/L  ?Lactic acid, plasma     Status: None  ? Collection Time: 04/07/21 10:00 PM  ?Result Value Ref Range  ? Lactic Acid, Venous 1.7 0.5 - 1.9 mmol/L  ?Urinalysis, Routine w reflex microscopic     Status: Abnormal  ? Collection Time: 04/07/21 10:00 PM  ?Result Value Ref Range  ? Color, Urine COLORLESS (A) YELLOW  ? APPearance CLEAR CLEAR  ? Specific Gravity, Urine 1.002 (L) 1.005 - 1.030  ? pH 6.0 5.0 - 8.0  ? Glucose, UA >=500 (A) NEGATIVE mg/dL  ? Hgb urine dipstick NEGATIVE NEGATIVE  ? Bilirubin Urine NEGATIVE NEGATIVE  ? Ketones, ur NEGATIVE NEGATIVE mg/dL  ? Protein, ur NEGATIVE NEGATIVE mg/dL  ? Nitrite NEGATIVE NEGATIVE  ? Leukocytes,Ua TRACE (A) NEGATIVE  ? RBC / HPF 0-5 0 - 5 RBC/hpf  ? WBC, UA 6-10 0 - 5 WBC/hpf  ? Bacteria, UA RARE (A) NONE SEEN  ? Squamous Epithelial / LPF 0-5 0 - 5  ? Non Squamous Epithelial 0-5 (A) NONE SEEN  ? ? ?Imaging Studies: ?CT ABDOMEN PELVIS W CONTRAST ? ?Result Date: 04/08/2021 ?CLINICAL DATA:  Left lower quadrant pain EXAM: CT ABDOMEN AND PELVIS WITH CONTRAST TECHNIQUE: Multidetector CT imaging of the abdomen and pelvis was performed using the standard protocol  following bolus administration of intravenous contrast. RADIATION DOSE REDUCTION: This exam was performed according to the departmental dose-optimization program which includes automated exposure control, adjustment of the mA and/or kV according to patient size and/or use of iterative reconstruction technique. CONTRAST:  152m OMNIPAQUE IOHEXOL 300 MG/ML  SOLN COMPARISON:  01/17/2016 FINDINGS: Lower chest: No acute abnormality Hepatobiliary: No focal hepatic abnormality. Gallbladder unremarkable. Pancreas: No focal abnormality or ductal dilatation. Spleen: No focal abnormality.  Normal size. Adrenals/Urinary Tract: Urinary bladder appears thick walled. No renal or ureteral mass. No stones or hydronephrosis. Stomach/Bowel: Stomach, large and small bowel grossly unremarkable. Vascular/Lymphatic: No evidence of aneurysm or adenopathy. Reproductive: Prior hysterectomy.  No adnexal masses. Other: No free fluid or free air. Musculoskeletal: No acute bony abnormality. IMPRESSION: Bladder wall appears mildly thickened which could reflect cystitis. Recommend clinical correlation. Electronically Signed   By: KRolm BaptiseM.D.   On: 04/08/2021 00:37  ? ?DG Chest Port 1 View ? ?Result Date: 04/07/2021 ?CLINICAL DATA:  Question of sepsis. Fever and left flank pain. Body aches. EXAM: PORTABLE CHEST 1 VIEW COMPARISON:  10/14/2015 FINDINGS: Shallow inspiration. The heart size and mediastinal contours are within normal limits. Both lungs are clear. The visualized skeletal structures are unremarkable. IMPRESSION: No active disease. Electronically Signed   By: WLucienne CapersM.D.   On: 04/07/2021 21:54   ? ?1:14 AM ?CT scan is unremarkable apart from bladder wall thickening.  Urinalysis shows a small amount of microscopic pyuria and a trace of leukocytes.  She could have a nascent urinary tract infection causing her symptoms.  We will give her Rocephin in the ED, culture her urine, and treat for UTI/early pyelonephritis. ? ? ?   ?MShanon Rosser MD ?04/08/21 0115 ? ?

## 2021-04-08 NOTE — ED Notes (Signed)
Patient updated on plan of care. ? ?Son remains at bedside ?

## 2021-04-10 LAB — URINE CULTURE: Culture: >=100000 — AB

## 2021-04-11 ENCOUNTER — Telehealth: Payer: Self-pay | Admitting: Emergency Medicine

## 2021-04-11 NOTE — Telephone Encounter (Signed)
Post ED Visit - Positive Culture Follow-up ? ?Culture report reviewed by antimicrobial stewardship pharmacist: ?[facility] ?'[]'$  Elenor Quinones, Pharm.D. ?'[]'$  Heide Guile, Pharm.D., BCPS AQ-ID ?'[]'$  Parks Neptune, Pharm.D., BCPS ?'[x]'$  Alycia Rossetti, Pharm.D., BCPS ?'[]'$  [place], Pharm.D., BCPS, AAHIVP ?'[]'$  Legrand Como, Pharm.D., BCPS, AAHIVP ?'[]'$  Salome Arnt, PharmD, BCPS ?'[]'$  Johnnette Gourd, PharmD, BCPS ?'[]'$  Hughes Better, PharmD, BCPS ?'[]'$  Leeroy Cha, PharmD ?'[]'$  Laqueta Linden, PharmD, BCPS ?'[]'$  Albertina Parr, PharmD ? ?[facility] Team ?'[]'$  Leodis Sias, PharmD ?'[]'$  Lindell Spar, PharmD ?'[]'$  Royetta Asal, PharmD ?'[]'$  Graylin Shiver, Rph ?'[]'$  Rema Fendt) Glennon Mac, PharmD ?'[]'$  Arlyn Dunning, PharmD ?'[]'$  Netta Cedars, PharmD ?'[]'$  Dia Sitter, PharmD ?'[]'$  Leone Haven, PharmD ?'[]'$  Gretta Arab, PharmD ?'[]'$  Theodis Shove, PharmD ?'[]'$  Peggyann Juba, PharmD ?'[]'$  Reuel Boom, PharmD ? ? ?Positive urine culture ?Treated with none, organism sensitive to the same and no further patient follow-up is required at this time. ? ?Hazle Nordmann ?04/11/2021, 10:02 AM ?  ?

## 2021-04-12 LAB — CULTURE, BLOOD (ROUTINE X 2)
Culture: NO GROWTH
Culture: NO GROWTH
Special Requests: ADEQUATE

## 2021-04-14 DIAGNOSIS — R600 Localized edema: Secondary | ICD-10-CM | POA: Diagnosis not present

## 2021-04-14 DIAGNOSIS — E119 Type 2 diabetes mellitus without complications: Secondary | ICD-10-CM | POA: Diagnosis not present

## 2021-04-14 DIAGNOSIS — E559 Vitamin D deficiency, unspecified: Secondary | ICD-10-CM | POA: Diagnosis not present

## 2021-04-14 DIAGNOSIS — I10 Essential (primary) hypertension: Secondary | ICD-10-CM | POA: Diagnosis not present

## 2021-04-14 DIAGNOSIS — E782 Mixed hyperlipidemia: Secondary | ICD-10-CM | POA: Diagnosis not present

## 2021-04-14 DIAGNOSIS — G629 Polyneuropathy, unspecified: Secondary | ICD-10-CM | POA: Diagnosis not present

## 2021-05-01 DIAGNOSIS — D508 Other iron deficiency anemias: Secondary | ICD-10-CM | POA: Diagnosis not present

## 2021-05-01 DIAGNOSIS — R600 Localized edema: Secondary | ICD-10-CM | POA: Diagnosis not present

## 2021-05-01 DIAGNOSIS — I10 Essential (primary) hypertension: Secondary | ICD-10-CM | POA: Diagnosis not present

## 2021-05-01 DIAGNOSIS — E119 Type 2 diabetes mellitus without complications: Secondary | ICD-10-CM | POA: Diagnosis not present

## 2021-05-24 DIAGNOSIS — E119 Type 2 diabetes mellitus without complications: Secondary | ICD-10-CM | POA: Diagnosis not present

## 2021-05-24 DIAGNOSIS — Z7984 Long term (current) use of oral hypoglycemic drugs: Secondary | ICD-10-CM | POA: Diagnosis not present

## 2021-05-24 DIAGNOSIS — I10 Essential (primary) hypertension: Secondary | ICD-10-CM | POA: Diagnosis not present

## 2021-05-24 DIAGNOSIS — Z7985 Long-term (current) use of injectable non-insulin antidiabetic drugs: Secondary | ICD-10-CM | POA: Diagnosis not present

## 2021-05-24 LAB — HEMOGLOBIN A1C: Hemoglobin A1C: 6.8

## 2021-06-21 ENCOUNTER — Ambulatory Visit: Payer: BC Managed Care – PPO | Admitting: Medical

## 2021-06-21 VITALS — BP 120/80 | HR 80 | Temp 97.5°F | Ht 61.0 in | Wt 135.6 lb

## 2021-06-21 DIAGNOSIS — M79609 Pain in unspecified limb: Secondary | ICD-10-CM

## 2021-06-21 DIAGNOSIS — I10 Essential (primary) hypertension: Secondary | ICD-10-CM

## 2021-06-21 DIAGNOSIS — D509 Iron deficiency anemia, unspecified: Secondary | ICD-10-CM

## 2021-06-21 DIAGNOSIS — M2540 Effusion, unspecified joint: Secondary | ICD-10-CM | POA: Insufficient documentation

## 2021-06-21 DIAGNOSIS — M2550 Pain in unspecified joint: Secondary | ICD-10-CM | POA: Diagnosis not present

## 2021-06-21 DIAGNOSIS — Z111 Encounter for screening for respiratory tuberculosis: Secondary | ICD-10-CM | POA: Diagnosis not present

## 2021-06-21 DIAGNOSIS — E1169 Type 2 diabetes mellitus with other specified complication: Secondary | ICD-10-CM

## 2021-06-21 DIAGNOSIS — Z79899 Other long term (current) drug therapy: Secondary | ICD-10-CM

## 2021-06-21 DIAGNOSIS — R0989 Other specified symptoms and signs involving the circulatory and respiratory systems: Secondary | ICD-10-CM

## 2021-06-21 DIAGNOSIS — M2560 Stiffness of unspecified joint, not elsewhere classified: Secondary | ICD-10-CM

## 2021-06-21 DIAGNOSIS — Z90710 Acquired absence of both cervix and uterus: Secondary | ICD-10-CM | POA: Insufficient documentation

## 2021-06-21 DIAGNOSIS — E119 Type 2 diabetes mellitus without complications: Secondary | ICD-10-CM | POA: Insufficient documentation

## 2021-06-21 NOTE — Progress Notes (Signed)
Subjective:  Andrea Mclaughlin is a 56 y.o. female who presents for Chief Complaint  Patient presents with   body swelling    Body swelling- arms and legs x 2 years. Affects her from walking and grabbing things. No other concerns     Medical team: Prior Dr. Raelyn Number at [facility]. Dr. Amalia Greenhouse, endocrinology Dr. Orlie Pollen, vascular surgery Lawerance Cruel, PA, cardiology Dr. Arlys John, rhuematology Pearson Forster, PA, [facility] in [place], [place]. Orland Jarred. Dr. Carol Ada, GI No hematologist or nephrologist  Here with her daughter who interprets.  I see her adult daughter for care as well.   Concerns: She has numerous concerns.     She has pains in a lot of places.   Has pain in entire arms, enter legs.  If putting arms up, has to use either arm to push the other arm back down by her side.  Her back hurts sometimes from work but not the same kind of pain as in her limbs.  Pains in knees, ankles, elbows, throughout arms and legs.  Decreased rom of wrists and fingers.  Cant bend fingers into a fist.   At end of day limbs feels stiff and tight.  They are tight in the morning, but symptoms worsen throughout the day.   She notes swelling.  Feet and legs swell, hands swell.  Lots of joint pains in the hands.  She has numbness in hands bilat.  She has seen rheumatology a few times at [facility].  She was on trials of Enbrel and Areva, but those did not seem to help.  Currently is on gabapentin and tizanidine.  She has had various labs in recent months.  Her daughter has her MyChart lab records on her phone for some of these.  No recent fevers or night sweats.   No recent weight changes.  No appetite changes.    Sees diabetes specialist.  She is on Actos, Ozempic and Jardiance.  She has been on Lasix several months after complaints of generalized swelling, but that has not seemed to make any big difference.  She has been on iron for  probably 7 months or more.  She had a colonoscopy in January that did not reveal any source of bleeding.  She is status post hysterectomy  No other aggravating or relieving factors.    No other c/o.  Past Medical History:  Diagnosis Date   Aching headache    Anemia    Complication of anesthesia    Diabetes mellitus    Hyperlipidemia    Hypertension    Spinal headache    Current Outpatient Medications on File Prior to Visit  Medication Sig Dispense Refill   acetaminophen (TYLENOL) 500 MG tablet Take 1,000 mg by mouth every 6 (six) hours as needed (pain).     empagliflozin (JARDIANCE) 25 MG TABS tablet Take by mouth daily.     furosemide (LASIX) 20 MG tablet Take 20 mg by mouth daily as needed.     iron polysaccharides (NIFEREX) 150 MG capsule Take 1 capsule by mouth daily.     lisinopril-hydrochlorothiazide (PRINZIDE,ZESTORETIC) 20-25 MG per tablet Take 1 tablet by mouth at bedtime.      pioglitazone (ACTOS) 30 MG tablet Take 30 mg by mouth daily.     rosuvastatin (CRESTOR) 20 MG tablet Take 20 mg by mouth daily.     Semaglutide, 1 MG/DOSE, (OZEMPIC, 1 MG/DOSE,) 4 MG/3ML SOPN Inject 1 mg into the  skin. '1mg'$  for 3 weeks     tiZANidine (ZANAFLEX) 4 MG tablet Take 4-8 mg by mouth every 8 (eight) hours as needed.     atenolol (TENORMIN) 25 MG tablet Take by mouth daily. (Patient not taking: Reported on 06/21/2021)     No current facility-administered medications on file prior to visit.     The following portions of the patient's history were reviewed and updated as appropriate: allergies, current medications, past family history, past medical history, past social history, past surgical history and problem list.  ROS Otherwise as in subjective above    Objective: BP 120/80   Pulse 80   Temp (!) 97.5 F (36.4 C)   Ht $R'5\' 1"'fU$  (1.549 m)   Wt 135 lb 9.6 oz (61.5 kg)   LMP 08/21/2013   BMI 25.62 kg/m   General appearance: alert, no distress, well developed, well nourished,  Hispanic female Neck: supple, no lymphadenopathy, no thyromegaly, no masses Heart: RRR, normal S1, S2, no murmurs Lungs: CTA bilaterally, no wheezes, rhonchi, or rales Abdomen: +bs, soft, non tender, non distended, no masses, no hepatomegaly, no splenomegaly MSK: Somewhat tender throughout fingers in general particularly MCPs bilaterally, there is some puffiness over the volar aspect of her left MCP at second and third digit, she cannot fully make a fist with either hand, wrist range of motion is very decreased bilaterally with pain but no swelling.  Ankles legs and hips in general nontender with out obvious deformity or swelling.  However she seems to have decreased range of motion of ankles bilaterally.  Elbow range of motion okay today.  No obvious deformity or swelling or nodules Arms and legs seem neurovascularly intact Pulses: 2+ radial pulses, 1+ pedal pulses, normal cap refill Ext: no edema   Assessment: Encounter Diagnoses  Name Primary?   Iron deficiency anemia, unspecified iron deficiency anemia type Yes   Essential hypertension, benign    Polyarthralgia    Joint swelling    Pain in extremity, unspecified extremity    High risk medication use    Screening for tuberculosis    S/P hysterectomy    Limited joint range of motion (ROM)    Decreased pedal pulses    Type 2 diabetes mellitus with other specified complication, without long-term current use of insulin ([place])      Plan: I reviewed her overall chart history and a long list of notes that her daughter written down with a timeline dating back from May 2021 till now of all the concerns she has had.  I reviewed labs that she had on her phone from her MyChart account.  As of April 2022 she had HLA-B27 negative, uric acid negative, CK normal, negative hepatitis screen for hepatitis A, B, and C, negative QuantiFERON gold  April 14, 2021 labs from another office showed negative [place], CBC with stable anemia hemoglobin in the 9  range, competence of metabolic panel normal except for glucose elevated at 127.  Lipids at that time showed triglycerides of 476, HDL 28.  B12 and folate were normal.  Iron was 21, iron sat 6 both significantly decreased.  Anemia-reportedly normal colonoscopy January 2023.  We will get a copy of the colonoscopy report.  Updated labs today.  Continue iron once daily however there is a strong chance we will request iron infusion and change iron to twice daily.  Hypertension-blood pressure at goal.  However we talked about possibly stopping Lasix as it does not seem to be offering any benefit and may  be doing more harm than good.  Diabetes-I will reach out to her endocrinologist about possibly cutting back or stopping Actos for the time being in case this is playing some role with her swelling.  She had some moderate swelling of her hands today but pictures they have on their phone show significant swelling of the hands at different times as well as the ankles.  Decreased pedal pulses-we will likely need to pursue ABIs  Joint swelling, decreased range of motion, polyarthralgia with prior trials of Enbrel and Areva.  We will request rheumatoid notes from arranged rheumatology  Hyperlipidemia-on statin but consider stopping statin for the time being to see if any of the pains resolve in her extremities  Malyiah was seen today for body swelling.  Diagnoses and all orders for this visit:  Iron deficiency anemia, unspecified iron deficiency anemia type -     Iron, TIBC and Ferritin Panel -     CBC with Differential/Platelet -     Reticulocytes  Essential hypertension, benign  Polyarthralgia -     CK  Joint swelling -     CK  Pain in extremity, unspecified extremity -     CK  High risk medication use -     CK  Screening for tuberculosis -     QuantiFERON-TB Gold Plus  S/P hysterectomy  Limited joint range of motion (ROM)  Decreased pedal pulses  Type 2 diabetes mellitus with other  specified complication, without long-term current use of insulin ([place])    Follow up: pending labs

## 2021-06-23 ENCOUNTER — Encounter: Payer: Self-pay | Admitting: Medical

## 2021-06-23 ENCOUNTER — Encounter: Payer: Self-pay | Admitting: Internal Medicine

## 2021-06-26 LAB — CBC WITH DIFFERENTIAL/PLATELET
Basophils Absolute: 0 10*3/uL (ref 0.0–0.2)
Basos: 0 %
EOS (ABSOLUTE): 0.4 10*3/uL (ref 0.0–0.4)
Eos: 5 %
Hematocrit: 32.9 % — ABNORMAL LOW (ref 34.0–46.6)
Hemoglobin: 10.7 g/dL — ABNORMAL LOW (ref 11.1–15.9)
Immature Grans (Abs): 0.1 10*3/uL (ref 0.0–0.1)
Immature Granulocytes: 1 %
Lymphocytes Absolute: 1.7 10*3/uL (ref 0.7–3.1)
Lymphs: 21 %
MCH: 22.9 pg — ABNORMAL LOW (ref 26.6–33.0)
MCHC: 32.5 g/dL (ref 31.5–35.7)
MCV: 70 fL — ABNORMAL LOW (ref 79–97)
Monocytes Absolute: 0.6 10*3/uL (ref 0.1–0.9)
Monocytes: 7 %
Neutrophils Absolute: 5.5 10*3/uL (ref 1.4–7.0)
Neutrophils: 66 %
Platelets: 297 10*3/uL (ref 150–450)
RBC: 4.67 x10E6/uL (ref 3.77–5.28)
RDW: 17.9 % — ABNORMAL HIGH (ref 11.7–15.4)
WBC: 8.3 10*3/uL (ref 3.4–10.8)

## 2021-06-26 LAB — IRON,TIBC AND FERRITIN PANEL
Ferritin: 7 ng/mL — ABNORMAL LOW (ref 15–150)
Iron Saturation: 7 % — CL (ref 15–55)
Iron: 26 ug/dL — ABNORMAL LOW (ref 27–159)
Total Iron Binding Capacity: 383 ug/dL (ref 250–450)
UIBC: 357 ug/dL (ref 131–425)

## 2021-06-26 LAB — QUANTIFERON-TB GOLD PLUS
QuantiFERON Mitogen Value: >10 IU/mL
QuantiFERON Nil Value: 0.02 IU/mL
QuantiFERON TB1 Ag Value: 0.01 IU/mL
QuantiFERON TB2 Ag Value: 0.03 IU/mL
QuantiFERON-TB Gold Plus: NEGATIVE

## 2021-06-26 LAB — CK: Total CK: 77 U/L (ref 32–182)

## 2021-06-26 LAB — RETICULOCYTES: Retic Ct Pct: 1.4 % (ref 0.6–2.6)

## 2021-06-29 ENCOUNTER — Encounter: Payer: Self-pay | Admitting: Internal Medicine

## 2021-06-29 ENCOUNTER — Encounter: Payer: Self-pay | Admitting: Medical

## 2021-07-03 ENCOUNTER — Non-Acute Institutional Stay (HOSPITAL_COMMUNITY)
Admission: RE | Admit: 2021-07-03 | Discharge: 2021-07-03 | Disposition: A | Discharge status: Home / Self Care | Payer: BC Managed Care – PPO | Source: Ambulatory Visit | Attending: Internal Medicine | Admitting: Internal Medicine

## 2021-07-03 DIAGNOSIS — D509 Iron deficiency anemia, unspecified: Secondary | ICD-10-CM | POA: Diagnosis not present

## 2021-07-03 MED ORDER — SODIUM CHLORIDE 0.9 % IV SOLN
510.0000 mg | Freq: Once | INTRAVENOUS | Status: AC
  Administered 2021-07-03: 510 mg via INTRAVENOUS
  Filled 2021-07-03: qty 17

## 2021-07-03 MED ORDER — SODIUM CHLORIDE 0.9 % IV SOLN: INTRAVENOUS | Status: DC | PRN

## 2021-07-03 NOTE — Progress Notes (Signed)
PATIENT CARE CENTER NOTE   Diagnosis: D50.9   Provider: Dorothea Ogle, PA   Procedure: Feraheme infusion    Note:  Patient received Feraheme infusion (dose 1 of 1) via PIV. Patient tolerated infusion well with no adverse reaction. Observed patient for 30 minutes post infusion. Vital signs stable. Discharge instructions given. Patient alert, oriented and ambulatory at discharge. Discharged home with daughter.

## 2021-07-13 ENCOUNTER — Telehealth: Payer: Self-pay | Admitting: Medical

## 2021-07-13 NOTE — Telephone Encounter (Signed)
Requested records from [facility] received.

## 2021-07-28 ENCOUNTER — Ambulatory Visit: Payer: BC Managed Care – PPO | Admitting: Medical

## 2021-07-28 VITALS — BP 120/80 | HR 83 | Wt 134.0 lb

## 2021-07-28 DIAGNOSIS — E781 Pure hyperglyceridemia: Secondary | ICD-10-CM

## 2021-07-28 DIAGNOSIS — D509 Iron deficiency anemia, unspecified: Secondary | ICD-10-CM

## 2021-07-28 DIAGNOSIS — M2540 Effusion, unspecified joint: Secondary | ICD-10-CM | POA: Diagnosis not present

## 2021-07-28 DIAGNOSIS — E1169 Type 2 diabetes mellitus with other specified complication: Secondary | ICD-10-CM

## 2021-07-28 DIAGNOSIS — M130 Polyarthritis, unspecified: Secondary | ICD-10-CM | POA: Diagnosis not present

## 2021-07-28 DIAGNOSIS — I10 Essential (primary) hypertension: Secondary | ICD-10-CM

## 2021-07-28 DIAGNOSIS — Z79899 Other long term (current) drug therapy: Secondary | ICD-10-CM

## 2021-07-28 NOTE — Progress Notes (Signed)
Subjective:  Andrea Mclaughlin is a 56 y.o. female who presents for Chief Complaint  Patient presents with   1 month follow-up    1 month follow-up , no concerns     Here for f/u.  Here with her daughter who interprets.     Medical team: Prior Dr. Raelyn Number at [facility]. Dr. Amalia Greenhouse, endocrinology Dr. Orlie Pollen, vascular surgery Andrea Cruel, PA, cardiology Dr. Arlys John, rheumatology Andrea Forster, PA, Orthopedics and Sports Medicine in [place], [place]. Andrea Mclaughlin. Dr. Carol Ada, GI No hematologist or nephrologist  I saw her as a new patient 06/21/21 for multiple concerns.   Last visit we asked her to make several changes.  Last visit she reported swelling in general and in joints.  We had her discontinue Lasix as it was not helping.  Her blood pressures have remained good but the swelling actually has improved since last visit.  We also had her stop Actos last visit.  She has spoken with her endocrinologist since last visit.  They were also okay with her Actos being discontinued but they did increase her Ozempic.  She also continues on Jardiance.  Last visit we set her up for iron infusion due to iron deficiency anemia.  She got her infusion.  She felt bad the day of the infusion afterwards but is doing okay now.  She is taking oral iron twice daily.  Here for recheck on iron labs.  Her main concern is ongoing polyarthritis.  She has seen [facility] rheumatology for a few visits last year but has not been back.  They tried a couple different medicines that did not seem to help.  No other aggravating or relieving factors.    No other c/o.  The following portions of the patient's history were reviewed and updated as appropriate: allergies, current medications, past family history, past medical history, past social history, past surgical history and problem list.  ROS Otherwise as in subjective above    Objective: BP 120/80   Pulse 83   Wt 134  lb (60.8 kg)   LMP 08/21/2013   BMI 25.32 kg/m   General appearance: alert, no distress, well developed, well nourished    Assessment: Encounter Diagnoses  Name Primary?   Iron deficiency anemia, unspecified iron deficiency anemia type Yes   Polyarthritis    Joint swelling    HYPERTRIGLYCERIDEMIA    HYPERTENSION, BENIGN ESSENTIAL    High risk medication use    Type 2 diabetes mellitus with other specified complication, without long-term current use of insulin (HCC)      Plan: Iron deficiency anemia-recheck labs today.  We are hoping for better numbers since her iron infusion recently.  She continues on iron twice daily.  This is her very first iron infusion.  She notes having a colonoscopy January 2023.  I do not have records on this yet.  Polyarthritis-referral back to [facility] rheumatology for follow-up.  She did not tolerate a couple different medicines prior already.  Hypertension-continue lisinopril HCT.  She stopped Lasix last visit and is doing fine off Lasix.  Hyperlipidemia-continue on statin.  She did not stop statin after last visit for trial but says she has done that in the past and that did not help her joint pains by discontinuing medication.  Diabetes-follow-up with endocrinology, continue Jardiance and Ozempic.  Actos discontinued last visit here due to side effects and concerns about its risk    [name] was seen today for 1 month follow-up.  Diagnoses and all orders for this visit:  Iron deficiency anemia, unspecified iron deficiency anemia type -     CBC with Differential/Platelet -     Iron  Polyarthritis -     Ambulatory referral to Rheumatology  Joint swelling  HYPERTRIGLYCERIDEMIA  HYPERTENSION, BENIGN ESSENTIAL  High risk medication use  Type 2 diabetes mellitus with other specified complication, without long-term current use of insulin ([place])    Follow up: Pending labs and referral

## 2021-07-29 LAB — CBC WITH DIFFERENTIAL/PLATELET
Basophils Absolute: 0 10*3/uL (ref 0.0–0.2)
Basos: 1 %
EOS (ABSOLUTE): 0.2 10*3/uL (ref 0.0–0.4)
Eos: 4 %
Hematocrit: 37.3 % (ref 34.0–46.6)
Hemoglobin: 11.7 g/dL (ref 11.1–15.9)
Immature Grans (Abs): 0 10*3/uL (ref 0.0–0.1)
Immature Granulocytes: 1 %
Lymphocytes Absolute: 1.2 10*3/uL (ref 0.7–3.1)
Lymphs: 19 %
MCH: 24.4 pg — ABNORMAL LOW (ref 26.6–33.0)
MCHC: 31.4 g/dL — ABNORMAL LOW (ref 31.5–35.7)
MCV: 78 fL — ABNORMAL LOW (ref 79–97)
Monocytes Absolute: 0.5 10*3/uL (ref 0.1–0.9)
Monocytes: 8 %
Neutrophils Absolute: 4.4 10*3/uL (ref 1.4–7.0)
Neutrophils: 67 %
Platelets: 235 10*3/uL (ref 150–450)
RBC: 4.79 x10E6/uL (ref 3.77–5.28)
RDW: 20.3 % — ABNORMAL HIGH (ref 11.7–15.4)
WBC: 6.4 10*3/uL (ref 3.4–10.8)

## 2021-07-29 LAB — IRON: Iron: 40 ug/dL (ref 27–159)

## 2021-08-17 ENCOUNTER — Encounter: Payer: Self-pay | Admitting: Internal Medicine

## 2021-08-18 ENCOUNTER — Encounter: Payer: Self-pay | Admitting: Medical

## 2021-09-11 ENCOUNTER — Other Ambulatory Visit: Payer: Self-pay | Admitting: Medical

## 2021-09-26 DIAGNOSIS — I10 Essential (primary) hypertension: Secondary | ICD-10-CM | POA: Diagnosis not present

## 2021-09-26 DIAGNOSIS — Z794 Long term (current) use of insulin: Secondary | ICD-10-CM | POA: Diagnosis not present

## 2021-09-26 DIAGNOSIS — E781 Pure hyperglyceridemia: Secondary | ICD-10-CM | POA: Diagnosis not present

## 2021-09-26 DIAGNOSIS — E119 Type 2 diabetes mellitus without complications: Secondary | ICD-10-CM | POA: Diagnosis not present

## 2021-10-04 ENCOUNTER — Encounter: Payer: Self-pay | Admitting: Internal Medicine

## 2021-10-09 DIAGNOSIS — E781 Pure hyperglyceridemia: Secondary | ICD-10-CM | POA: Diagnosis not present

## 2021-10-09 DIAGNOSIS — E119 Type 2 diabetes mellitus without complications: Secondary | ICD-10-CM | POA: Diagnosis not present

## 2021-10-09 DIAGNOSIS — Z794 Long term (current) use of insulin: Secondary | ICD-10-CM | POA: Diagnosis not present

## 2021-10-12 DIAGNOSIS — M791 Myalgia, unspecified site: Secondary | ICD-10-CM | POA: Diagnosis not present

## 2021-10-12 DIAGNOSIS — R5383 Other fatigue: Secondary | ICD-10-CM | POA: Diagnosis not present

## 2021-10-12 DIAGNOSIS — M0609 Rheumatoid arthritis without rheumatoid factor, multiple sites: Secondary | ICD-10-CM | POA: Diagnosis not present

## 2021-10-12 DIAGNOSIS — M79642 Pain in left hand: Secondary | ICD-10-CM | POA: Diagnosis not present

## 2021-10-18 DIAGNOSIS — F4323 Adjustment disorder with mixed anxiety and depressed mood: Secondary | ICD-10-CM | POA: Diagnosis not present

## 2021-11-07 ENCOUNTER — Encounter: Payer: Self-pay | Admitting: Internal Medicine

## 2021-11-13 ENCOUNTER — Other Ambulatory Visit: Payer: Self-pay | Admitting: Medical

## 2021-11-13 MED ORDER — LISINOPRIL-HYDROCHLOROTHIAZIDE 20-25 MG PO TABS: 1.0000 | ORAL_TABLET | Freq: Every day | ORAL | 0 refills | Status: DC

## 2021-11-15 DIAGNOSIS — F4323 Adjustment disorder with mixed anxiety and depressed mood: Secondary | ICD-10-CM | POA: Diagnosis not present

## 2021-11-29 DIAGNOSIS — F4323 Adjustment disorder with mixed anxiety and depressed mood: Secondary | ICD-10-CM | POA: Diagnosis not present

## 2021-12-04 DIAGNOSIS — M79641 Pain in right hand: Secondary | ICD-10-CM | POA: Diagnosis not present

## 2021-12-04 DIAGNOSIS — M797 Fibromyalgia: Secondary | ICD-10-CM | POA: Diagnosis not present

## 2021-12-04 DIAGNOSIS — M79642 Pain in left hand: Secondary | ICD-10-CM | POA: Diagnosis not present

## 2021-12-04 DIAGNOSIS — M0609 Rheumatoid arthritis without rheumatoid factor, multiple sites: Secondary | ICD-10-CM | POA: Diagnosis not present

## 2021-12-07 DIAGNOSIS — B349 Viral infection, unspecified: Secondary | ICD-10-CM | POA: Diagnosis not present

## 2021-12-07 DIAGNOSIS — R110 Nausea: Secondary | ICD-10-CM | POA: Diagnosis not present

## 2021-12-07 DIAGNOSIS — R509 Fever, unspecified: Secondary | ICD-10-CM | POA: Diagnosis not present

## 2021-12-07 DIAGNOSIS — R52 Pain, unspecified: Secondary | ICD-10-CM | POA: Diagnosis not present

## 2021-12-07 DIAGNOSIS — M545 Low back pain, unspecified: Secondary | ICD-10-CM | POA: Diagnosis not present

## 2021-12-07 DIAGNOSIS — E119 Type 2 diabetes mellitus without complications: Secondary | ICD-10-CM | POA: Diagnosis not present

## 2021-12-13 DIAGNOSIS — F4323 Adjustment disorder with mixed anxiety and depressed mood: Secondary | ICD-10-CM | POA: Diagnosis not present

## 2021-12-27 DIAGNOSIS — E119 Type 2 diabetes mellitus without complications: Secondary | ICD-10-CM | POA: Diagnosis not present

## 2021-12-27 DIAGNOSIS — I10 Essential (primary) hypertension: Secondary | ICD-10-CM | POA: Diagnosis not present

## 2021-12-27 DIAGNOSIS — Z794 Long term (current) use of insulin: Secondary | ICD-10-CM | POA: Diagnosis not present

## 2021-12-27 DIAGNOSIS — E781 Pure hyperglyceridemia: Secondary | ICD-10-CM | POA: Diagnosis not present

## 2021-12-27 DIAGNOSIS — Z7984 Long term (current) use of oral hypoglycemic drugs: Secondary | ICD-10-CM | POA: Diagnosis not present

## 2021-12-27 DIAGNOSIS — Z7985 Long-term (current) use of injectable non-insulin antidiabetic drugs: Secondary | ICD-10-CM | POA: Diagnosis not present

## 2022-01-03 DIAGNOSIS — F4323 Adjustment disorder with mixed anxiety and depressed mood: Secondary | ICD-10-CM | POA: Diagnosis not present

## 2022-01-07 DIAGNOSIS — R509 Fever, unspecified: Secondary | ICD-10-CM | POA: Diagnosis not present

## 2022-01-07 DIAGNOSIS — R112 Nausea with vomiting, unspecified: Secondary | ICD-10-CM | POA: Diagnosis not present

## 2022-01-07 DIAGNOSIS — N390 Urinary tract infection, site not specified: Secondary | ICD-10-CM | POA: Diagnosis not present

## 2022-01-07 DIAGNOSIS — R197 Diarrhea, unspecified: Secondary | ICD-10-CM | POA: Diagnosis not present

## 2022-01-07 DIAGNOSIS — R051 Acute cough: Secondary | ICD-10-CM | POA: Diagnosis not present

## 2022-01-23 DIAGNOSIS — F4323 Adjustment disorder with mixed anxiety and depressed mood: Secondary | ICD-10-CM | POA: Diagnosis not present

## 2022-01-24 ENCOUNTER — Encounter: Payer: Self-pay | Admitting: Medical

## 2022-01-24 ENCOUNTER — Ambulatory Visit: Payer: BC Managed Care – PPO | Admitting: Medical

## 2022-01-24 VITALS — BP 122/80 | HR 90 | Resp 17 | Wt 124.0 lb

## 2022-01-24 DIAGNOSIS — I10 Essential (primary) hypertension: Secondary | ICD-10-CM | POA: Diagnosis not present

## 2022-01-24 DIAGNOSIS — D509 Iron deficiency anemia, unspecified: Secondary | ICD-10-CM

## 2022-01-24 DIAGNOSIS — Z23 Encounter for immunization: Secondary | ICD-10-CM

## 2022-01-24 DIAGNOSIS — M0600 Rheumatoid arthritis without rheumatoid factor, unspecified site: Secondary | ICD-10-CM

## 2022-01-24 DIAGNOSIS — Z79899 Other long term (current) drug therapy: Secondary | ICD-10-CM

## 2022-01-24 DIAGNOSIS — Z114 Encounter for screening for human immunodeficiency virus [HIV]: Secondary | ICD-10-CM | POA: Diagnosis not present

## 2022-01-24 DIAGNOSIS — Z1159 Encounter for screening for other viral diseases: Secondary | ICD-10-CM | POA: Diagnosis not present

## 2022-01-24 DIAGNOSIS — S99912A Unspecified injury of left ankle, initial encounter: Secondary | ICD-10-CM

## 2022-01-24 DIAGNOSIS — W19XXXA Unspecified fall, initial encounter: Secondary | ICD-10-CM

## 2022-01-24 DIAGNOSIS — E1169 Type 2 diabetes mellitus with other specified complication: Secondary | ICD-10-CM

## 2022-01-24 DIAGNOSIS — E781 Pure hyperglyceridemia: Secondary | ICD-10-CM

## 2022-01-24 NOTE — Progress Notes (Signed)
Subjective:  Andrea Mclaughlin is a 56 y.o. female who presents for Chief Complaint  Patient presents with   Medication Refill    Pt is here for a med recheck on Ozempic ,Lisinopril  Pt states she hurt her left ankle ,pt fell x 2 days ago  Foot is swollen , pt did not go to the ER  Been using ICE and elevation      Here for f/u on chronic conditions.  Here with her daughter who interprets.     Medical team: Prior Dr. Raelyn Mclaughlin at [facility]. Dr. Amalia Mclaughlin, endocrinology Dr. Orlie Mclaughlin, vascular surgery Andrea Cruel, PA, cardiology Dr. Arlys Mclaughlin, rheumatology Andrea Forster, PA, Orthopedics and Sports Medicine in [place], [place]. Andrea Mclaughlin. Dr. Carol Mclaughlin, GI No hematologist or nephrologist   Concerns: Here for routine chronic disease follow-up.  Since her last visit she has been rheumatology and endocrinology  I saw her as a new patient 06/21/21 for multiple concerns.   Fell at Christmas.   Twisted ankle and fell laterally, as her daughter cat tripped her.  Has pain , swelling but can weight bear.   DOI 01/22/22.  Compliant with medicaiton for diabetes, rheumatoid, cholesterol.  No issues otherwise.  Had iron infusion few months ago, no recent fatigue, bleeding or bruising.  No other aggravating or relieving factors.    No other c/o.  Past Medical History:  Diagnosis Date   Aching headache    Anemia    Complication of anesthesia    Diabetes mellitus    Hyperlipidemia    Hypertension    Seronegative rheumatoid arthritis ([place])    Spinal headache    Current Outpatient Medications on File Prior to Visit  Medication Sig Dispense Refill   acetaminophen (TYLENOL) 500 MG tablet Take 1,000 mg by mouth every 6 (six) hours as needed (pain).     celecoxib (CELEBREX) 200 MG capsule Take 200 mg by mouth 2 (two) times daily.     empagliflozin (JARDIANCE) 25 MG TABS tablet Take by mouth daily.     lisinopril-hydrochlorothiazide (ZESTORETIC) 20-25 MG  tablet Take 1 tablet by mouth at bedtime. 90 tablet 0   pioglitazone (ACTOS) 30 MG tablet Take 30 mg by mouth daily.     rosuvastatin (CRESTOR) 20 MG tablet TAKE 1 TABLET BY MOUTH EVERY DAY 30 tablet 2   Semaglutide, 2 MG/DOSE, (OZEMPIC, 2 MG/DOSE,) 8 MG/3ML SOPN Inject 2 mg into the skin once a week.     Semaglutide, 2 MG/DOSE, (OZEMPIC, 2 MG/DOSE,) 8 MG/3ML SOPN Inject 2 mg into the skin once a week.     Upadacitinib ER 15 MG TB24 Take by mouth.     tiZANidine (ZANAFLEX) 4 MG tablet Take 4-8 mg by mouth every 8 (eight) hours as needed. (Patient not taking: Reported on 01/24/2022)     No current facility-administered medications on file prior to visit.     The following portions of the patient's history were reviewed and updated as appropriate: allergies, current medications, past family history, past medical history, past social history, past surgical history and problem list.  ROS Otherwise as in subjective above    Objective: BP 122/80   Pulse 90   Resp 17   Wt 124 lb (56.2 kg)   LMP 08/21/2013   SpO2 96%   BMI 23.43 kg/m   General appearence: alert, no distress, WD/WN,  Neck: supple, no lymphadenopathy, no thyromegaly, no masses Heart: RRR, normal S1, S2, no murmurs Lungs: CTA bilaterally,  no wheezes, rhonchi, or rales MSK: left ankle with generalized swelling and purplish bruising bilat, tender over entire ankle, but ROM relatively full although with some pain, she was weight bearing at triage.  Rest of leg unremarkable Pulses: 2+ symmetric, upper and lower extremities, normal cap refill  Diabetic Foot Exam - Simple   Simple Foot Form Diabetic Foot exam was performed with the following findings: Yes 01/24/2022  3:54 PM  Visual Inspection See comments: Yes Sensation Testing Intact to touch and monofilament testing bilaterally: Yes Pulse Check Posterior Tibialis and Dorsalis pulse intact bilaterally: Yes Comments See MSK exam       Assessment: Encounter  Diagnoses  Name Primary?   Iron deficiency anemia, unspecified iron deficiency anemia type Yes   High risk medication use    HYPERTENSION, BENIGN ESSENTIAL    Type 2 diabetes mellitus with other specified complication, without long-term current use of insulin (HCC)    HYPERTRIGLYCERIDEMIA    Seronegative rheumatoid arthritis ([place])    Injury of left ankle, initial encounter    Fall, initial encounter    Encounter for hepatitis C screening test for low risk patient    Screening for HIV (human immunodeficiency virus)    Flu vaccine need       Plan: Hypertension-continue lisinopril HCT 20/25 mg daily.   Diabetes-updated hemoglobin A1c today.  She is also due for microalbumin.  I reviewed her recent endocrinology notes.  Continue current medication Ozempic 2 mg weekly, Jardiance 25 mg daily  History of iron deficiency anemia-updated labs today.  She had iron infusion earlier this year.  She notes having a colonoscopy January 2023.   Seronegative rheumatoid arthritis-I reviewed her rheumatology notes from November 2023.    Dyslipidemia-continue Crestor 20 mg daily.  She had elevated triglycerides on last labs a few months ago.  Counseled on diet, exercise.  Ankle injury - advised xray. She wants to hold off for now.  Advised ankle brace which she is doing, ice/cold therapy BID, rest, and recheck 7-10 days.   Vaccine counseling Recommend you have a shingles vaccine.  Return on a different day for this vaccine.  I recommend yearly flu shot  Counseled on the influenza virus vaccine.  Vaccine information sheet given.  Influenza vaccine given after consent obtained.   Ellsie was seen today for medication refill.  Diagnoses and all orders for this visit:  Iron deficiency anemia, unspecified iron deficiency anemia type -     Iron, TIBC and Ferritin Panel  High risk medication use  HYPERTENSION, BENIGN ESSENTIAL  Type 2 diabetes mellitus with other specified complication, without  long-term current use of insulin (HCC) -     Hemoglobin A1c -     Microalbumin/Creatinine Ratio, Urine  HYPERTRIGLYCERIDEMIA  Seronegative rheumatoid arthritis ([place])  Injury of left ankle, initial encounter  Fall, initial encounter  Encounter for hepatitis C screening test for low risk patient -     Hepatitis C antibody  Screening for HIV (human immunodeficiency virus) -     HIV Antibody (routine testing w rflx)  Flu vaccine need -     Flu Vaccine QUAD 6+ mos PF IM (Fluarix Quad PF)    Follow up: Pending labs

## 2022-01-25 LAB — IRON,TIBC AND FERRITIN PANEL
Ferritin: 84 ng/mL (ref 15–150)
Iron Saturation: 19 % (ref 15–55)
Iron: 51 ug/dL (ref 27–159)
Total Iron Binding Capacity: 275 ug/dL (ref 250–450)
UIBC: 224 ug/dL (ref 131–425)

## 2022-01-25 LAB — HEMOGLOBIN A1C
Est. average glucose Bld gHb Est-mCnc: 154 mg/dL
Hgb A1c MFr Bld: 7 % — ABNORMAL HIGH (ref 4.8–5.6)

## 2022-01-25 LAB — MICROALBUMIN / CREATININE URINE RATIO
Creatinine, Urine: 17.8 mg/dL
Microalb/Creat Ratio: <17 mg/g creat (ref 0–29)
Microalbumin, Urine: <3 ug/mL

## 2022-01-25 LAB — HEPATITIS C ANTIBODY: Hep C Virus Ab: NONREACTIVE

## 2022-01-25 LAB — HIV ANTIBODY (ROUTINE TESTING W REFLEX): HIV Screen 4th Generation wRfx: NONREACTIVE

## 2022-01-25 NOTE — Progress Notes (Signed)
Results sent through MyChart

## 2022-01-26 ENCOUNTER — Other Ambulatory Visit: Payer: Self-pay | Admitting: Medical

## 2022-01-26 MED ORDER — ROSUVASTATIN CALCIUM 20 MG PO TABS: 20.0000 mg | ORAL_TABLET | Freq: Every day | ORAL | 3 refills | Status: DC

## 2022-01-26 NOTE — Progress Notes (Signed)
Results sent through MyChart

## 2022-02-07 DIAGNOSIS — F4323 Adjustment disorder with mixed anxiety and depressed mood: Secondary | ICD-10-CM | POA: Diagnosis not present

## 2022-02-21 DIAGNOSIS — F4323 Adjustment disorder with mixed anxiety and depressed mood: Secondary | ICD-10-CM | POA: Diagnosis not present

## 2022-03-06 DIAGNOSIS — M79642 Pain in left hand: Secondary | ICD-10-CM | POA: Diagnosis not present

## 2022-03-06 DIAGNOSIS — M79672 Pain in left foot: Secondary | ICD-10-CM | POA: Diagnosis not present

## 2022-03-06 DIAGNOSIS — M130 Polyarthritis, unspecified: Secondary | ICD-10-CM | POA: Diagnosis not present

## 2022-03-06 DIAGNOSIS — M797 Fibromyalgia: Secondary | ICD-10-CM | POA: Diagnosis not present

## 2022-03-06 DIAGNOSIS — M79641 Pain in right hand: Secondary | ICD-10-CM | POA: Diagnosis not present

## 2022-03-06 DIAGNOSIS — M0609 Rheumatoid arthritis without rheumatoid factor, multiple sites: Secondary | ICD-10-CM | POA: Diagnosis not present

## 2022-03-16 DIAGNOSIS — I10 Essential (primary) hypertension: Secondary | ICD-10-CM | POA: Diagnosis not present

## 2022-03-16 DIAGNOSIS — E785 Hyperlipidemia, unspecified: Secondary | ICD-10-CM | POA: Diagnosis not present

## 2022-03-16 DIAGNOSIS — R079 Chest pain, unspecified: Secondary | ICD-10-CM | POA: Diagnosis not present

## 2022-03-16 DIAGNOSIS — R112 Nausea with vomiting, unspecified: Secondary | ICD-10-CM | POA: Diagnosis not present

## 2022-03-18 ENCOUNTER — Other Ambulatory Visit: Payer: Self-pay | Admitting: Medical

## 2022-03-21 DIAGNOSIS — F4323 Adjustment disorder with mixed anxiety and depressed mood: Secondary | ICD-10-CM | POA: Diagnosis not present

## 2022-04-11 DIAGNOSIS — F4323 Adjustment disorder with mixed anxiety and depressed mood: Secondary | ICD-10-CM | POA: Diagnosis not present

## 2022-04-19 ENCOUNTER — Other Ambulatory Visit: Payer: Self-pay | Admitting: Internal Medicine

## 2022-04-19 MED ORDER — LISINOPRIL-HYDROCHLOROTHIAZIDE 20-25 MG PO TABS: 1.0000 | ORAL_TABLET | Freq: Every day | ORAL | 0 refills | Status: DC

## 2022-04-19 NOTE — Telephone Encounter (Signed)
From: Jerrol Banana Nasca To: Office of Dorothea Ogle, [place] Sent: 04/19/2022 12:39 PM EDT Subject: Medication Renewal Request  Refills have been requested for the following medications:   lisinopril-hydrochlorothiazide (ZESTORETIC) 20-25 MG tablet [Shane Tysinger]  Preferred pharmacy: [facility], [place] - [address] AT [place] Delivery method: Arlyss Gandy

## 2022-05-09 IMAGING — DX DG CHEST 1V PORT
1 series · 1 of 1 positions shown · non-contrast
Comparison: 10/14/2015

CLINICAL DATA: Question of sepsis. Fever and left flank pain. Body
aches.

EXAM:
PORTABLE CHEST 1 VIEW

[chest ap]
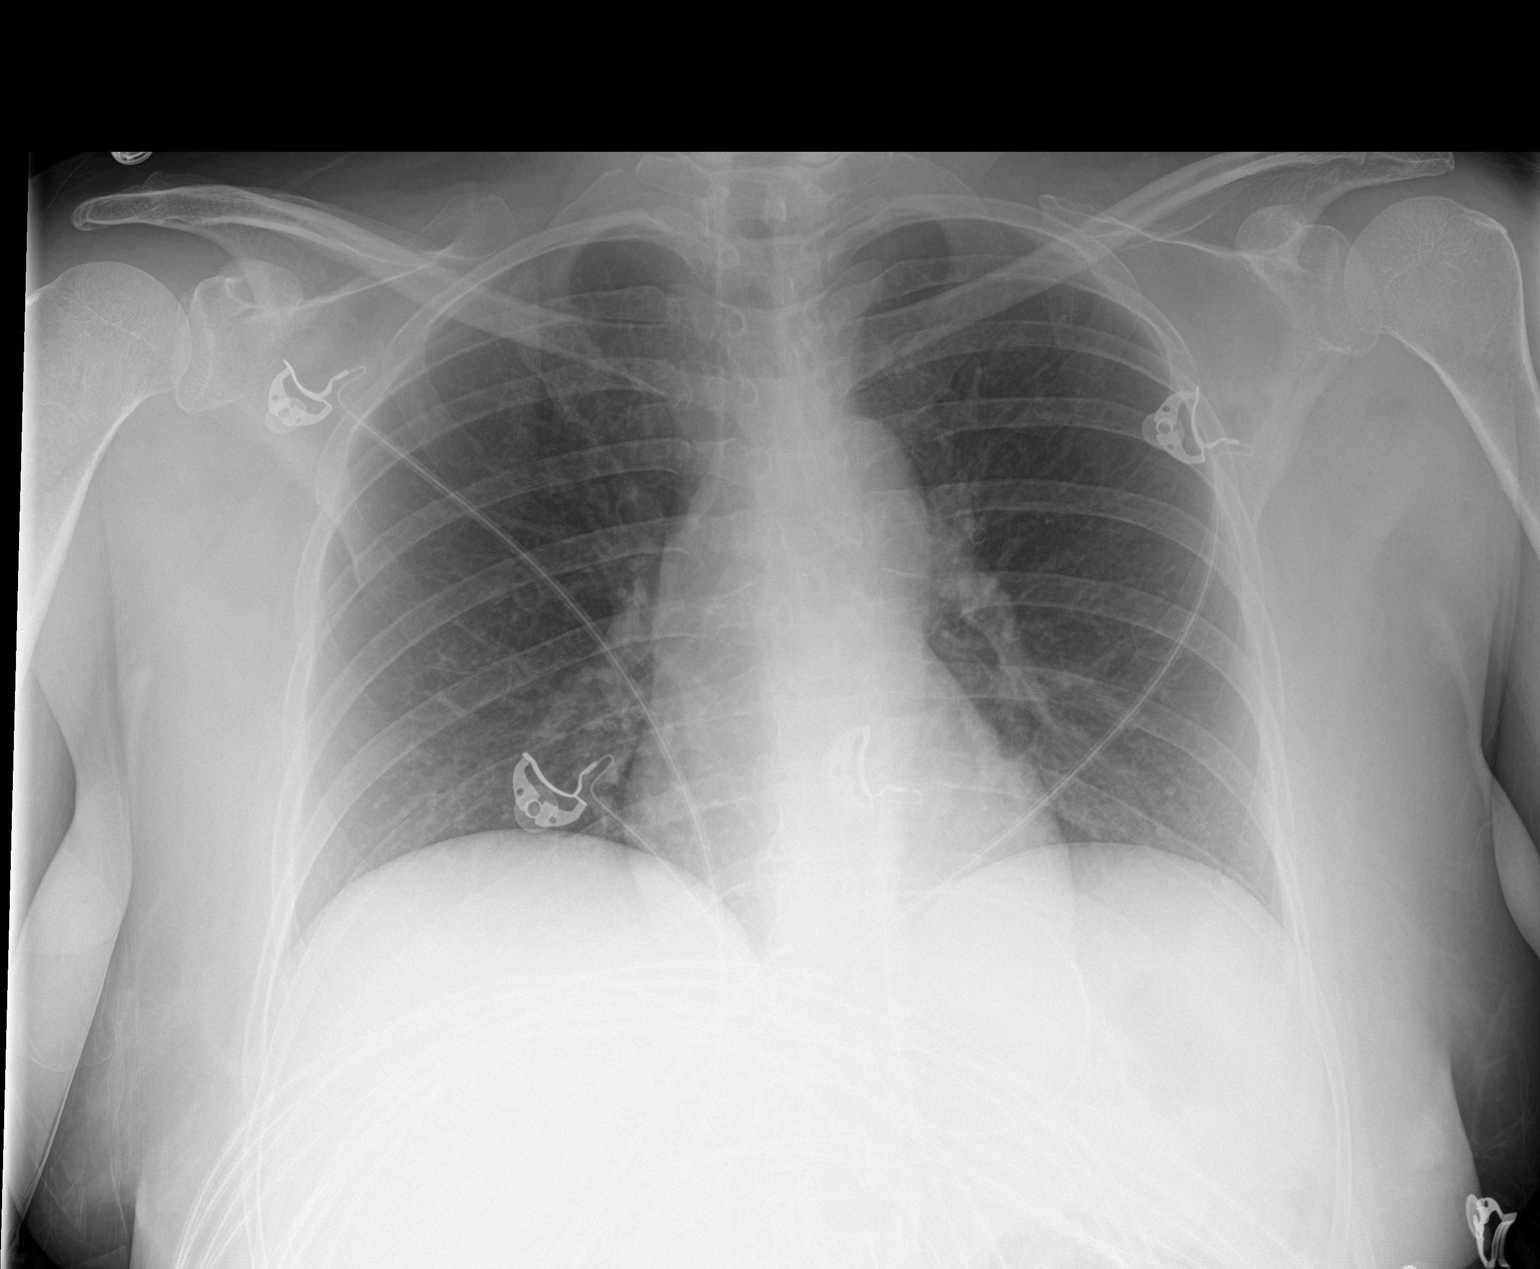

[1 of 1 positions shown; findings below may reference images not displayed]

FINDINGS: Shallow inspiration. The heart size and mediastinal contours are
within normal limits. Both lungs are clear. The visualized skeletal
structures are unremarkable.
IMPRESSION: No active disease.

## 2022-05-15 ENCOUNTER — Other Ambulatory Visit: Payer: Self-pay | Admitting: Medical

## 2022-05-18 DIAGNOSIS — Z7985 Long-term (current) use of injectable non-insulin antidiabetic drugs: Secondary | ICD-10-CM | POA: Diagnosis not present

## 2022-05-18 DIAGNOSIS — Z7984 Long term (current) use of oral hypoglycemic drugs: Secondary | ICD-10-CM | POA: Diagnosis not present

## 2022-05-18 DIAGNOSIS — I10 Essential (primary) hypertension: Secondary | ICD-10-CM | POA: Diagnosis not present

## 2022-05-18 DIAGNOSIS — E119 Type 2 diabetes mellitus without complications: Secondary | ICD-10-CM | POA: Diagnosis not present

## 2022-05-18 DIAGNOSIS — E781 Pure hyperglyceridemia: Secondary | ICD-10-CM | POA: Diagnosis not present

## 2022-08-16 ENCOUNTER — Other Ambulatory Visit: Payer: Self-pay | Admitting: Medical

## 2022-09-04 DIAGNOSIS — M79671 Pain in right foot: Secondary | ICD-10-CM | POA: Diagnosis not present

## 2022-09-04 DIAGNOSIS — M0609 Rheumatoid arthritis without rheumatoid factor, multiple sites: Secondary | ICD-10-CM | POA: Diagnosis not present

## 2022-09-04 DIAGNOSIS — M797 Fibromyalgia: Secondary | ICD-10-CM | POA: Diagnosis not present

## 2022-09-04 DIAGNOSIS — Z79899 Other long term (current) drug therapy: Secondary | ICD-10-CM | POA: Diagnosis not present

## 2022-09-04 DIAGNOSIS — R5383 Other fatigue: Secondary | ICD-10-CM | POA: Diagnosis not present

## 2022-09-20 ENCOUNTER — Ambulatory Visit: Payer: BC Managed Care – PPO | Admitting: Podiatry

## 2022-09-20 ENCOUNTER — Ambulatory Visit: Payer: BC Managed Care – PPO

## 2022-09-20 DIAGNOSIS — M84374A Stress fracture, right foot, initial encounter for fracture: Secondary | ICD-10-CM

## 2022-09-20 DIAGNOSIS — M7671 Peroneal tendinitis, right leg: Secondary | ICD-10-CM

## 2022-09-20 DIAGNOSIS — M779 Enthesopathy, unspecified: Secondary | ICD-10-CM | POA: Diagnosis not present

## 2022-09-20 NOTE — Progress Notes (Signed)
Subjective:   Patient ID: Andrea Mclaughlin, female   DOB: 57 y.o.   MRN: [id number]   HPI Chief Complaint  Patient presents with   Foot Pain    C/o pain to the right lateral aspect. Pain started about 3 months ago.    57 year old female presents the office with above concerns.  She has tried anti-inflammatory medication without significant improvement she thinks that she may have a stress fracture.  She has had a stress fracture previously.  No recent injuries that she reports.   Review of Systems  All other systems reviewed and are negative.  Past Medical History:  Diagnosis Date   Aching headache    Anemia    Complication of anesthesia    Diabetes mellitus    Hyperlipidemia    Hypertension    Seronegative rheumatoid arthritis (HCC)    Spinal headache     Past Surgical History:  Procedure Laterality Date   ABDOMINAL HYSTERECTOMY N/A 11/09/2013   Procedure: HYSTERECTOMY ABDOMINAL;  Surgeon: Juluis Mire, MD;  Location: [facility];  Service: Gynecology;  Laterality: N/A;   APPENDECTOMY     BILATERAL SALPINGECTOMY Bilateral 11/09/2013   Procedure: BILATERAL SALPINGECTOMY;  Surgeon: Juluis Mire, MD;  Location: [facility];  Service: Gynecology;  Laterality: Bilateral;   CESAREAN SECTION     DILITATION & CURRETTAGE/HYSTROSCOPY WITH HYDROTHERMAL ABLATION N/A 09/16/2013   Procedure: DILATATION & CURETTAGE/HYSTEROSCOPY WITH HYDROTHERMAL ABLATION;  Surgeon: Juluis Mire, MD;  Location: [facility];  Service: Gynecology;  Laterality: N/A;  Failed HTA   DILITATION & CURRETTAGE/HYSTROSCOPY WITH THERMACHOICE ABLATION N/A 09/16/2013   Procedure: DILATATION & CURETTAGE/HYSTEROSCOPY WITH THERMACHOICE ABLATION;  Surgeon: Juluis Mire, MD;  Location: [facility];  Service: Gynecology;  Laterality: N/A;   LAPAROSCOPY N/A 11/09/2013   Procedure: LAPAROSCOPY DIAGNOSTIC;  Surgeon: Juluis Mire, MD;  Location: [facility];  Service: Gynecology;  Laterality: N/A;   OOPHORECTOMY Right 11/09/2013   Procedure:  OOPHORECTOMY;  Surgeon: Juluis Mire, MD;  Location: [facility];  Service: Gynecology;  Laterality: Right;     Current Outpatient Medications:    acetaminophen (TYLENOL) 500 MG tablet, Take 1,000 mg by mouth every 6 (six) hours as needed (pain)., Disp: , Rfl:    celecoxib (CELEBREX) 200 MG capsule, Take 200 mg by mouth 2 (two) times daily., Disp: , Rfl:    empagliflozin (JARDIANCE) 25 MG TABS tablet, Take by mouth daily., Disp: , Rfl:    lisinopril-hydrochlorothiazide (ZESTORETIC) 20-25 MG tablet, Take 1 tablet by mouth at bedtime., Disp: 90 tablet, Rfl: 0   pioglitazone (ACTOS) 30 MG tablet, Take 30 mg by mouth daily., Disp: , Rfl:    rosuvastatin (CRESTOR) 20 MG tablet, TAKE 1 TABLET BY MOUTH EVERY DAY, Disp: 30 tablet, Rfl: 2   Semaglutide, 2 MG/DOSE, (OZEMPIC, 2 MG/DOSE,) 8 MG/3ML SOPN, Inject 2 mg into the skin once a week., Disp: , Rfl:    Semaglutide, 2 MG/DOSE, (OZEMPIC, 2 MG/DOSE,) 8 MG/3ML SOPN, Inject 2 mg into the skin once a week., Disp: , Rfl:    tiZANidine (ZANAFLEX) 4 MG tablet, Take 4-8 mg by mouth every 8 (eight) hours as needed. (Patient not taking: Reported on 01/24/2022), Disp: , Rfl:    Upadacitinib ER 15 MG TB24, Take by mouth., Disp: , Rfl:   Allergies  Allergen Reactions   Metformin And Related     diarrhea          Objective:  Physical Exam  General: AAO x3, NAD-translator present  Dermatological: Skin  is warm, dry and supple bilateral.  There are no open sores, no preulcerative lesions, no rash or signs of infection present.  Vascular: Dorsalis Pedis artery and Posterior Tibial artery pedal pulses are 2/4 bilateral with immedate capillary fill time.  There is no pain with calf compression, swelling, warmth, erythema.   Neruologic: Grossly intact via light touch bilateral.   Musculoskeletal: Most the tenderness is localized in the course the peroneal tendon posterior to the lateral malleolus curving towards the fifth metatarsal base.  Tenderness with  metatarsal base.  This no other areas of pinpoint tenderness noted today.  There is localized edema there is no erythema or warmth.     Assessment:   57 year old female with peroneal tendinitis     Plan:  -Treatment options discussed including all alternatives, risks, and complications -Etiology of symptoms were discussed -X-rays were obtained and reviewed with the patient.  3 views of the foot were obtained.  There is no evidence of acute fracture.  Osteopenia is present.  There is a sclerotic line along the cuboid which could represent stress fracture to this area. -Given her discomfort recommend immobilization in cam boot which is of since today to help facilitate healing.  Continue ice, elevate as well as anti-inflammatories that she is already taking.  If no improvement consider advanced imaging.  Vivi Barrack DPM      boot Insole next appt

## 2022-10-03 DIAGNOSIS — U071 COVID-19: Secondary | ICD-10-CM | POA: Diagnosis not present

## 2022-10-03 DIAGNOSIS — J028 Acute pharyngitis due to other specified organisms: Secondary | ICD-10-CM | POA: Diagnosis not present

## 2022-11-01 ENCOUNTER — Ambulatory Visit: Payer: BC Managed Care – PPO | Admitting: Podiatry

## 2023-01-02 DIAGNOSIS — M1991 Primary osteoarthritis, unspecified site: Secondary | ICD-10-CM | POA: Diagnosis not present

## 2023-01-02 DIAGNOSIS — M797 Fibromyalgia: Secondary | ICD-10-CM | POA: Diagnosis not present

## 2023-01-02 DIAGNOSIS — Z79899 Other long term (current) drug therapy: Secondary | ICD-10-CM | POA: Diagnosis not present

## 2023-01-02 DIAGNOSIS — M0609 Rheumatoid arthritis without rheumatoid factor, multiple sites: Secondary | ICD-10-CM | POA: Diagnosis not present

## 2023-04-03 DIAGNOSIS — E119 Type 2 diabetes mellitus without complications: Secondary | ICD-10-CM | POA: Diagnosis not present

## 2023-04-03 DIAGNOSIS — M797 Fibromyalgia: Secondary | ICD-10-CM | POA: Diagnosis not present

## 2023-04-03 DIAGNOSIS — M1991 Primary osteoarthritis, unspecified site: Secondary | ICD-10-CM | POA: Diagnosis not present

## 2023-04-03 DIAGNOSIS — M0609 Rheumatoid arthritis without rheumatoid factor, multiple sites: Secondary | ICD-10-CM | POA: Diagnosis not present

## 2023-04-03 DIAGNOSIS — E782 Mixed hyperlipidemia: Secondary | ICD-10-CM | POA: Diagnosis not present

## 2023-04-03 DIAGNOSIS — Z79899 Other long term (current) drug therapy: Secondary | ICD-10-CM | POA: Diagnosis not present

## 2023-04-03 DIAGNOSIS — Z794 Long term (current) use of insulin: Secondary | ICD-10-CM | POA: Diagnosis not present

## 2023-04-15 ENCOUNTER — Ambulatory Visit: Payer: BC Managed Care – PPO | Admitting: Medical

## 2023-04-15 ENCOUNTER — Encounter: Payer: Self-pay | Admitting: Medical

## 2023-04-15 VITALS — BP 122/80 | HR 92 | Ht 61.5 in | Wt 139.5 lb

## 2023-04-15 DIAGNOSIS — E119 Type 2 diabetes mellitus without complications: Secondary | ICD-10-CM | POA: Diagnosis not present

## 2023-04-15 DIAGNOSIS — R7989 Other specified abnormal findings of blood chemistry: Secondary | ICD-10-CM | POA: Diagnosis not present

## 2023-04-15 DIAGNOSIS — Z23 Encounter for immunization: Secondary | ICD-10-CM

## 2023-04-15 DIAGNOSIS — E781 Pure hyperglyceridemia: Secondary | ICD-10-CM

## 2023-04-15 DIAGNOSIS — I10 Essential (primary) hypertension: Secondary | ICD-10-CM

## 2023-04-15 DIAGNOSIS — Z1231 Encounter for screening mammogram for malignant neoplasm of breast: Secondary | ICD-10-CM | POA: Diagnosis not present

## 2023-04-15 DIAGNOSIS — Z1389 Encounter for screening for other disorder: Secondary | ICD-10-CM

## 2023-04-15 DIAGNOSIS — Z0000 Encounter for general adult medical examination without abnormal findings: Secondary | ICD-10-CM

## 2023-04-15 DIAGNOSIS — M0600 Rheumatoid arthritis without rheumatoid factor, unspecified site: Secondary | ICD-10-CM | POA: Diagnosis not present

## 2023-04-15 DIAGNOSIS — N952 Postmenopausal atrophic vaginitis: Secondary | ICD-10-CM | POA: Insufficient documentation

## 2023-04-15 DIAGNOSIS — Z794 Long term (current) use of insulin: Secondary | ICD-10-CM

## 2023-04-15 DIAGNOSIS — E1169 Type 2 diabetes mellitus with other specified complication: Secondary | ICD-10-CM | POA: Diagnosis not present

## 2023-04-15 DIAGNOSIS — N941 Unspecified dyspareunia: Secondary | ICD-10-CM

## 2023-04-15 DIAGNOSIS — Z7185 Encounter for immunization safety counseling: Secondary | ICD-10-CM

## 2023-04-15 DIAGNOSIS — B351 Tinea unguium: Secondary | ICD-10-CM

## 2023-04-15 DIAGNOSIS — Z124 Encounter for screening for malignant neoplasm of cervix: Secondary | ICD-10-CM

## 2023-04-15 MED ORDER — LISINOPRIL-HYDROCHLOROTHIAZIDE 20-25 MG PO TABS: 1.0000 | ORAL_TABLET | Freq: Every day | ORAL | 2 refills | Status: AC

## 2023-04-15 MED ORDER — ESTRADIOL 0.1 MG/GM VA CREA: 1.0000 | TOPICAL_CREAM | VAGINAL | 2 refills | Status: AC

## 2023-04-15 MED ORDER — ROSUVASTATIN CALCIUM 20 MG PO TABS: 20.0000 mg | ORAL_TABLET | Freq: Every day | ORAL | 2 refills | Status: AC

## 2023-04-15 MED ORDER — CICLOPIROX OLAMINE 0.77 % EX SUSP: 1.0000 | Freq: Two times a day (BID) | CUTANEOUS | 2 refills | Status: AC

## 2023-04-15 NOTE — Progress Notes (Signed)
 Subjective:   HPI  Andrea Mclaughlin is a 58 y.o. female who presents for Chief Complaint  Patient presents with   Annual Exam    Fasting cpe, coughing x 1 month that won't go away, and her back is now hurting because of it. Look at toes- possible fungus. Sees obgyn [facility]- 4 seasons    Patient Care Team: Jessly Lebeck, Cleda Mccreedy as PCP - General (Family Medicine) Dr. Izell Rosendale, endocrinology Dr. Zenovia Jordan, Rheumatology Dr. Ovid Curd, podiatry Dr. Jeani Hawking, GI Dr. Gerarda Fraction, vascular surgery   Concerns: Here for well visit and med check. She is accompanied by her adult daughter Tucker who interprets for [place] today who is a paramedic  She has been doing relatively well.  She has concerns about toenail fungus.  Not using any current treatment  She sees endocrinology for diabetes and just saw them recently and had labs.  She sees rheumatology for seronegative rheumatoid thrice.  She just had labs from them as well recently  Her liver tests are elevated recently.  She has been on Rinvoq for around a year.  She has been on other medications listed for more than a year  Blood sugars at home typically less than 130 fasting and sometimes she will see an occasional 140 or 150.   Reviewed their medical, surgical, family, social, medication, and allergy history and updated chart as appropriate.  Allergies  Allergen Reactions   Metformin And Related     diarrhea    Past Medical History:  Diagnosis Date   Aching headache    Anemia    Complication of anesthesia    Diabetes mellitus    Hyperlipidemia    Hypertension    Seronegative rheumatoid arthritis (HCC)    Spinal headache     Current Outpatient Medications on File Prior to Visit  Medication Sig Dispense Refill   celecoxib (CELEBREX) 200 MG capsule Take 200 mg by mouth 2 (two) times daily.     empagliflozin (JARDIANCE) 25 MG TABS tablet Take by mouth daily.     pioglitazone (ACTOS) 30 MG tablet Take 30  mg by mouth daily.     Semaglutide, 2 MG/DOSE, (OZEMPIC, 2 MG/DOSE,) 8 MG/3ML SOPN Inject 2 mg into the skin once a week.     acetaminophen (TYLENOL) 500 MG tablet Take 1,000 mg by mouth every 6 (six) hours as needed (pain).     No current facility-administered medications on file prior to visit.      Current Outpatient Medications:    celecoxib (CELEBREX) 200 MG capsule, Take 200 mg by mouth 2 (two) times daily., Disp: , Rfl:    ciclopirox (LOPROX) 0.77 % SUSP, Apply 1 Application topically 2 (two) times daily., Disp: 60 mL, Rfl: 2   empagliflozin (JARDIANCE) 25 MG TABS tablet, Take by mouth daily., Disp: , Rfl:    estradiol (ESTRACE VAGINAL) 0.1 MG/GM vaginal cream, Place 1 Applicatorful vaginally 3 (three) times a week., Disp: 42.5 g, Rfl: 2   pioglitazone (ACTOS) 30 MG tablet, Take 30 mg by mouth daily., Disp: , Rfl:    Semaglutide, 2 MG/DOSE, (OZEMPIC, 2 MG/DOSE,) 8 MG/3ML SOPN, Inject 2 mg into the skin once a week., Disp: , Rfl:    acetaminophen (TYLENOL) 500 MG tablet, Take 1,000 mg by mouth every 6 (six) hours as needed (pain)., Disp: , Rfl:    lisinopril-hydrochlorothiazide (ZESTORETIC) 20-25 MG tablet, Take 1 tablet by mouth at bedtime., Disp: 90 tablet, Rfl: 2   rosuvastatin (CRESTOR) 20  MG tablet, Take 1 tablet (20 mg total) by mouth daily., Disp: 90 tablet, Rfl: 2  Family History  Problem Relation Age of Onset   Hypertension Mother    Lung disease Father    Aneurysm Sister    Brain cancer Brother    Anesthesia problems Neg Hx     Past Surgical History:  Procedure Laterality Date   ABDOMINAL HYSTERECTOMY N/A 11/09/2013   Procedure: HYSTERECTOMY ABDOMINAL;  Surgeon: Juluis Mire, MD;  Location: [facility];  Service: Gynecology;  Laterality: N/A;   APPENDECTOMY     BILATERAL SALPINGECTOMY Bilateral 11/09/2013   Procedure: BILATERAL SALPINGECTOMY;  Surgeon: Juluis Mire, MD;  Location: [facility];  Service: Gynecology;  Laterality: Bilateral;   CESAREAN SECTION     DILITATION  & CURRETTAGE/HYSTROSCOPY WITH HYDROTHERMAL ABLATION N/A 09/16/2013   Procedure: DILATATION & CURETTAGE/HYSTEROSCOPY WITH HYDROTHERMAL ABLATION;  Surgeon: Juluis Mire, MD;  Location: [facility];  Service: Gynecology;  Laterality: N/A;  Failed HTA   DILITATION & CURRETTAGE/HYSTROSCOPY WITH THERMACHOICE ABLATION N/A 09/16/2013   Procedure: DILATATION & CURETTAGE/HYSTEROSCOPY WITH THERMACHOICE ABLATION;  Surgeon: Juluis Mire, MD;  Location: [facility];  Service: Gynecology;  Laterality: N/A;   LAPAROSCOPY N/A 11/09/2013   Procedure: LAPAROSCOPY DIAGNOSTIC;  Surgeon: Juluis Mire, MD;  Location: [facility];  Service: Gynecology;  Laterality: N/A;   OOPHORECTOMY Right 11/09/2013   Procedure: OOPHORECTOMY;  Surgeon: Juluis Mire, MD;  Location: [facility];  Service: Gynecology;  Laterality: Right;    Review of Systems  Constitutional:  Negative for chills, fever, malaise/fatigue and weight loss.  HENT:  Negative for congestion, ear pain, hearing loss, sore throat and tinnitus.   Eyes:  Negative for blurred vision, pain and redness.  Respiratory:  Negative for cough, hemoptysis and shortness of breath.   Cardiovascular:  Negative for chest pain, palpitations, orthopnea, claudication and leg swelling.  Gastrointestinal:  Negative for abdominal pain, blood in stool, constipation, diarrhea, nausea and vomiting.  Genitourinary:  Negative for dysuria, flank pain, frequency, hematuria and urgency.  Musculoskeletal:  Negative for falls, joint pain and myalgias.  Skin:  Negative for itching and rash.  Neurological:  Negative for dizziness, tingling, speech change, weakness and headaches.  Endo/Heme/Allergies:  Negative for polydipsia. Does not bruise/bleed easily.  Psychiatric/Behavioral:  Negative for depression and memory loss. The patient is not nervous/anxious and does not have insomnia.         Objective:  BP 122/80   Pulse 92   Ht 5' 1.5" (1.562 m)   Wt 139 lb 8 oz (63.3 kg)   LMP 08/21/2013   BMI 25.93  kg/m   General appearance: alert, no distress, WD/WN, Hispanic female Skin: unremarkable HEENT: normocephalic, conjunctiva/corneas normal, sclerae anicteric, PERRLA, EOMi, nares patent, no discharge or erythema, pharynx normal Oral cavity: MMM, tongue normal, teeth normal Neck: supple, no lymphadenopathy, no thyromegaly, no masses, normal ROM, no bruits Chest: non tender, normal shape and expansion Heart: RRR, normal S1, S2, no murmurs Lungs: CTA bilaterally, no wheezes, rhonchi, or rales Abdomen: +bs, soft, non tender, non distended, no masses, no hepatomegaly, no splenomegaly, no bruits Back: non tender, normal ROM, no scoliosis Musculoskeletal: upper extremities non tender, no obvious deformity, normal ROM throughout, lower extremities non tender, no obvious deformity, normal ROM throughout Extremities: no edema, no cyanosis, no clubbing Pulses: 2+ symmetric, upper and lower extremities, normal cap refill Neurological: alert, oriented x 3, CN2-12 intact, strength normal upper extremities and lower extremities, sensation normal throughout, DTRs 2+ throughout, no cerebellar  signs, gait normal Psychiatric: normal affect, behavior normal, pleasant  Breast: nontender, no masses or lumps, no skin changes, no nipple discharge or inversion, no axillary lymphadenopathy Gyn: Normal external genitalia without lesions, atrophic tissue and changes, unable to use speculum due to small introitus and discomfort on exam, bimanual exam reveals no cervix or lesions, no abnormal vaginal discharge.  Uterus and adnexa not enlarged, nontender, no masses.  Exam chaperoned by nurse. Rectal: deferred  Diabetic Foot Exam - Simple   Simple Foot Form Diabetic Foot exam was performed with the following findings: Yes 04/15/2023 12:57 PM  Visual Inspection See comments: Yes Sensation Testing Intact to touch and monofilament testing bilaterally: Yes Pulse Check See comments: Yes Comments 1+ pedal pulses, there is  some discolored appearance of right great toenail left great toenail and left third toenail suggestive of onychomycosis       Assessment and Plan :   Encounter Diagnoses  Name Primary?   Encounter for health maintenance examination in adult Yes   Encounter for screening mammogram for malignant neoplasm of breast    Screening for cervical cancer    Vaccine counseling    Screening for hematuria or proteinuria    Need for pneumococcal 20-valent conjugate vaccination    Type 2 diabetes mellitus with other specified complication, without long-term current use of insulin (HCC)    Type 2 diabetes mellitus without complication, with long-term current use of insulin (HCC)    Seronegative rheumatoid arthritis (HCC)    HYPERTRIGLYCERIDEMIA    HYPERTENSION, BENIGN ESSENTIAL    Elevated LFTs    Onychomycosis    Atrophic vaginitis    Dyspareunia, female      This visit was a preventative care visit, also known as wellness visit or routine physical.   Topics typically include healthy lifestyle, diet, exercise, preventative care, vaccinations, sick and well care, proper use of emergency dept and after hours care, as well as other concerns.    Separate significant issues discussed: Atrophic vaginitis-begin trial of Estrace cream 3 days a week.  Discussed proper use  Dyspareunia associated with atrophic vaginitis-advise referral to pelvic floor physical therapy.  She will consider but declines for now.  We discussed using lubricants for intercourse, patience  Onychomycosis-begin ciclopirox topical solution.  Given some recent elevated LFTs, this is a safer approach for now  Elevated LFTs-discussed possible causes.  I suspect fatty liver disease.  Repeat liver test today.  She had recent elevated LFTs mildly but has not had a history of elevated liver test.  Negative hepatitis C and recent couple years.  Will do a hepatitis B surface antigen test today.  She sees rheumatology for polyarthritis  and seronegative rheumatoid arthritis.  I reviewed her recent visit notes  She sees endocrinology for diabetes.  I reviewed recent visit notes  Hypertension continue current medication lisinopril HCT 20/25 mg daily  I reviewed recent labs she had done.  Rheumatology and endocrinology.  Some of those were on her follow-up through MyChart including recent CBC that was normal.  Triglycerides are elevated around 158 but LDL was at goal.  General Recommendations: Continue to return yearly for your annual wellness and preventative care visits.  This gives [place] a chance to discuss healthy lifestyle, exercise, vaccinations, review your chart record, and perform screenings where appropriate.  I recommend you see your eye doctor yearly for routine vision care.  I recommend you see your dentist yearly for routine dental care including hygiene visits twice yearly.   Vaccination recommendations were  reviewed Immunization History  Administered Date(s) Administered   Influenza Split 10/28/2009, 11/10/2013, 03/05/2018   Influenza Whole 10/28/2009   Influenza,inj,Quad PF,6+ Mos 11/10/2013, 12/16/2016, 12/01/2018, 11/05/2019, 01/24/2022   PNEUMOCOCCAL CONJUGATE-20 04/15/2023   Pneumococcal Polysaccharide-23 12/05/2009, 11/11/2013   Tdap 07/27/2017   Counseled on the pneumococcal vaccine.  Vaccine information sheet given.  Pneumococcal vaccine Prevnar 20 given after consent obtained.   Screening for cancer: Colon cancer screening: Prior or last colon cancer screen: 01/2021, repeat 10 years, Dr. Jeani Hawking  Breast cancer screening: You should perform a self breast exam monthly.   We reviewed recommendations for regular mammograms and breast cancer screening. Last mammogram: call to schedule, due now  Cervical cancer screening: We reviewed recommendations for pap smear screening. Last pap - s/p hysterecomty   Skin cancer screening: Check your skin regularly for new changes, growing lesions, or  other lesions of concern Come in for evaluation if you have skin lesions of concern.  Lung cancer screening: If you have a greater than 20 pack year history of tobacco use, then you may qualify for lung cancer screening with a chest CT scan.   Please call your insurance company to inquire about coverage for this test.  Pancreatic cancer: no current screening test is available routinely recommended.  (Risk factors: Smoking, overweight or obese, diabetes, chronic pancreatitis, work Nurse, mental health, Solicitor, 65 year old or greater, female greater than female, African-American, family history of pancreatic cancer, hereditary breast, ovarian, melanoma, Lynch, Peutz-jeghers).  We currently don't have screenings for other cancers besides breast, cervical, colon, and lung cancers.  If you have a strong family history of cancer or have other cancer screening concerns, please let me know.    Bone health: Get at least 150 minutes of aerobic exercise weekly Get weight bearing exercise at least once weekly Bone density test:  A bone density test is an imaging test that uses a type of X-ray to measure the amount of calcium and other minerals in your bones. The test may be used to diagnose or screen you for a condition that causes weak or thin bones (osteoporosis), predict your risk for a broken bone (fracture), or determine how well your osteoporosis treatment is working. The bone density test is recommended for females 65 and older, or females or males <65 if certain risk factors such as thyroid disease, long term use of steroids such as for asthma or rheumatological issues, vitamin D deficiency, estrogen deficiency, family history of osteoporosis, self or family history of fragility fracture in first degree relative.    Heart health: Get at least 150 minutes of aerobic exercise weekly Limit alcohol It is important to maintain a healthy blood pressure and healthy cholesterol numbers  Heart  disease screening: Screening for heart disease includes screening for blood pressure, fasting lipids, glucose/diabetes screening, BMI height to weight ratio, reviewed of smoking status, physical activity, and diet.    Goals include blood pressure 120/80 or less, maintaining a healthy lipid/cholesterol profile, preventing diabetes or keeping diabetes numbers under good control, not smoking or using tobacco products, exercising most days per week or at least 150 minutes per week of exercise, and eating healthy variety of fruits and vegetables, healthy oils, and avoiding unhealthy food choices like fried food, fast food, high sugar and high cholesterol foods.    Other tests may possibly include EKG test, CT coronary calcium score, echocardiogram, exercise treadmill stress test.     Vascular disease screening: For high risk individuals including smokers, diabetes, patients with known heart disease  or high blood pressure, kidney disease, and others, screening for vascular disease or atherosclerosis of the arteries is available.  Examples may include carotid ultrasound, abdominal aortic ultrasound, ABI blood flow screening in the legs, thoracic aorta screening.    Medical care options: I recommend you continue to seek care here first for routine care.  We try really hard to have available appointments Monday through Friday daytime hours for sick visits, acute visits, and physicals.  Urgent care should be used for after hours and weekends for significant issues that cannot wait till the next day.  The emergency department should be used for significant potentially life-threatening emergencies.  The emergency department is expensive, can often have long wait times for less significant concerns, so try to utilize primary care, urgent care, or telemedicine when possible to avoid unnecessary trips to the emergency department.  Virtual visits and telemedicine have been introduced since the pandemic started in  2020, and can be convenient ways to receive medical care.  We offer virtual appointments as well to assist you in a variety of options to seek medical care.   Legal Take the time to do a Last Will and Testament, advanced directives including Healthcare Power of Attorney and Living Will documents.  Do not leave your family with burdens that can be handled ahead of time.   Advanced Directives: I recommend you consider completing a Health Care Power of Attorney and Living Will.   These documents respect your wishes and help alleviate burdens on your loved ones if you were to become terminally ill or be in a position to need those documents enforced.    You can complete Advanced Directives yourself, have them notarized, then have copies made for our office, for you and for anybody you feel should have them in safe keeping.  Or, you can have an attorney prepare these documents.   If you haven't updated your Last Will and Testament in a while, it may be worthwhile having an attorney prepare these documents together and save on some costs.       Spiritual and Emotional Health Keeping a healthy spiritual life can help you better manage your physical health. Your spiritual life can help you to cope with any issues that may arise with your physical health.  Balance can keep [place] healthy and help [place] to recover.  If you are struggling with your spiritual health there are questions that you may want to ask yourself:  What makes me feel most complete? When do I feel most connected to the rest of the world? Where do I find the most inner strength? What am I doing when I feel whole?  Helpful tips: Being in nature. Some people feel very connected and at peace when they are walking outdoors or are outside. Helping others. Some feel the largest sense of wellbeing when they are of service to others. Being of service can take on many forms. It can be doing volunteer work, being kind to strangers, or offering a  hand to a friend in need. Gratitude. Some people find they feel the most connected when they remain grateful. They may make lists of all the things they are grateful for or say a thank you out loud for all they have.    Emotional Health Are you in tune with your emotional health?  Check out this link: http://www.marquez-love.com/   Financial Health Make sure you use a budget for your personal finances Make sure you are insured against risks (health insurance,  life insurance, auto insurance, Catering manager) Save more, spend less Set financial goals If you need help in this area, good resources include counseling through [employer] or other community resources, have a meeting with a Social research officer, government, and a good resource is [employer] was seen today for annual exam.  Diagnoses and all orders for this visit:  Encounter for health maintenance examination in adult -     Pneumococcal conjugate vaccine 20-valent (Prevnar 20) -     MM 3D SCREENING MAMMOGRAM BILATERAL BREAST -     Hemoglobin A1c -     Microalbumin/Creatinine Ratio, Urine -     Hepatitis B surface antigen -     Cancel: POCT Urinalysis DIP (Proadvantage Device) -     Hepatic function panel  Encounter for screening mammogram for malignant neoplasm of breast -     MM 3D SCREENING MAMMOGRAM BILATERAL BREAST -     Cancel: POCT Urinalysis DIP (Proadvantage Device)  Screening for cervical cancer  Vaccine counseling  Screening for hematuria or proteinuria  Need for pneumococcal 20-valent conjugate vaccination  Type 2 diabetes mellitus with other specified complication, without long-term current use of insulin (HCC) -     Hemoglobin A1c -     Microalbumin/Creatinine Ratio, Urine  Type 2 diabetes mellitus without complication, with long-term current use of insulin (HCC)  Seronegative rheumatoid arthritis (HCC)  HYPERTRIGLYCERIDEMIA  HYPERTENSION, BENIGN ESSENTIAL  Elevated LFTs -      Hepatitis B surface antigen -     Hepatic function panel  Onychomycosis  Atrophic vaginitis  Dyspareunia, female  Other orders -     ciclopirox (LOPROX) 0.77 % SUSP; Apply 1 Application topically 2 (two) times daily. -     estradiol (ESTRACE VAGINAL) 0.1 MG/GM vaginal cream; Place 1 Applicatorful vaginally 3 (three) times a week. -     rosuvastatin (CRESTOR) 20 MG tablet; Take 1 tablet (20 mg total) by mouth daily. -     lisinopril-hydrochlorothiazide (ZESTORETIC) 20-25 MG tablet; Take 1 tablet by mouth at bedtime.    Follow-up pending labs, yearly for physical

## 2023-04-15 NOTE — Patient Instructions (Signed)
Please call to schedule your mammogram.   [facility]  [phone] [address], [address] [place], [place] [address]  

## 2023-04-16 LAB — HEPATIC FUNCTION PANEL
ALT: 40 IU/L — ABNORMAL HIGH (ref 0–32)
AST: 29 IU/L (ref 0–40)
Albumin: 4.6 g/dL (ref 3.8–4.9)
Alkaline Phosphatase: 104 IU/L (ref 44–121)
Bilirubin Total: 0.3 mg/dL (ref 0.0–1.2)
Bilirubin, Direct: 0.12 mg/dL (ref 0.00–0.40)
Total Protein: 7.7 g/dL (ref 6.0–8.5)

## 2023-04-16 LAB — HEMOGLOBIN A1C
Est. average glucose Bld gHb Est-mCnc: 166 mg/dL
Hgb A1c MFr Bld: 7.4 % — ABNORMAL HIGH (ref 4.8–5.6)

## 2023-04-16 LAB — MICROALBUMIN / CREATININE URINE RATIO
Creatinine, Urine: 83.6 mg/dL
Microalb/Creat Ratio: 14 mg/g{creat} (ref 0–29)
Microalbumin, Urine: 11.5 ug/mL

## 2023-04-16 LAB — HEPATITIS B SURFACE ANTIGEN: Hepatitis B Surface Ag: NEGATIVE

## 2023-04-16 NOTE — Progress Notes (Signed)
 Please send copy of her labs in the office note to her endocrinologist and rheumatologist if they are not in epic  Dr. Izell Frenchburg (endo) and Dr. Zenovia Jordan (rheum)

## 2023-04-23 ENCOUNTER — Ambulatory Visit
Admission: RE | Admit: 2023-04-23 | Discharge: 2023-04-23 | Disposition: A | Discharge status: Home / Self Care | Source: Ambulatory Visit | Attending: Medical | Admitting: Medical

## 2023-04-23 DIAGNOSIS — Z1231 Encounter for screening mammogram for malignant neoplasm of breast: Secondary | ICD-10-CM | POA: Diagnosis not present

## 2023-05-10 DIAGNOSIS — E781 Pure hyperglyceridemia: Secondary | ICD-10-CM | POA: Diagnosis not present

## 2023-05-10 DIAGNOSIS — Z794 Long term (current) use of insulin: Secondary | ICD-10-CM | POA: Diagnosis not present

## 2023-05-10 DIAGNOSIS — E119 Type 2 diabetes mellitus without complications: Secondary | ICD-10-CM | POA: Diagnosis not present

## 2023-05-10 DIAGNOSIS — I10 Essential (primary) hypertension: Secondary | ICD-10-CM | POA: Diagnosis not present

## 2023-07-04 DIAGNOSIS — M0609 Rheumatoid arthritis without rheumatoid factor, multiple sites: Secondary | ICD-10-CM | POA: Diagnosis not present

## 2023-07-04 DIAGNOSIS — R7989 Other specified abnormal findings of blood chemistry: Secondary | ICD-10-CM | POA: Diagnosis not present

## 2023-07-04 DIAGNOSIS — E782 Mixed hyperlipidemia: Secondary | ICD-10-CM | POA: Diagnosis not present

## 2023-07-10 DIAGNOSIS — M1991 Primary osteoarthritis, unspecified site: Secondary | ICD-10-CM | POA: Diagnosis not present

## 2023-07-10 DIAGNOSIS — M0609 Rheumatoid arthritis without rheumatoid factor, multiple sites: Secondary | ICD-10-CM | POA: Diagnosis not present

## 2023-07-10 DIAGNOSIS — Z79899 Other long term (current) drug therapy: Secondary | ICD-10-CM | POA: Diagnosis not present

## 2023-07-30 ENCOUNTER — Ambulatory Visit: Admitting: Medical

## 2023-08-07 ENCOUNTER — Ambulatory Visit: Admitting: Medical

## 2023-10-03 DIAGNOSIS — M0609 Rheumatoid arthritis without rheumatoid factor, multiple sites: Secondary | ICD-10-CM | POA: Diagnosis not present

## 2023-10-03 DIAGNOSIS — M1991 Primary osteoarthritis, unspecified site: Secondary | ICD-10-CM | POA: Diagnosis not present

## 2023-10-03 DIAGNOSIS — Z79899 Other long term (current) drug therapy: Secondary | ICD-10-CM | POA: Diagnosis not present

## 2023-10-03 DIAGNOSIS — M797 Fibromyalgia: Secondary | ICD-10-CM | POA: Diagnosis not present

## 2023-11-25 DIAGNOSIS — E119 Type 2 diabetes mellitus without complications: Secondary | ICD-10-CM | POA: Diagnosis not present

## 2023-11-25 DIAGNOSIS — E781 Pure hyperglyceridemia: Secondary | ICD-10-CM | POA: Diagnosis not present

## 2023-11-25 DIAGNOSIS — Z794 Long term (current) use of insulin: Secondary | ICD-10-CM | POA: Diagnosis not present

## 2024-01-16 DIAGNOSIS — M1991 Primary osteoarthritis, unspecified site: Secondary | ICD-10-CM | POA: Diagnosis not present

## 2024-01-16 DIAGNOSIS — M0609 Rheumatoid arthritis without rheumatoid factor, multiple sites: Secondary | ICD-10-CM | POA: Diagnosis not present

## 2024-01-16 DIAGNOSIS — R5383 Other fatigue: Secondary | ICD-10-CM | POA: Diagnosis not present

## 2024-01-16 DIAGNOSIS — Z79899 Other long term (current) drug therapy: Secondary | ICD-10-CM | POA: Diagnosis not present

## 2024-01-16 DIAGNOSIS — E782 Mixed hyperlipidemia: Secondary | ICD-10-CM | POA: Diagnosis not present

## 2024-01-19 LAB — LAB REPORT - SCANNED
EGFR: 79
HM Hepatitis Screen: NEGATIVE

## 2024-02-14 ENCOUNTER — Encounter: Payer: Self-pay | Admitting: Nurse Practitioner

## 2024-02-14 ENCOUNTER — Ambulatory Visit: Payer: Self-pay

## 2024-02-14 ENCOUNTER — Ambulatory Visit: Admitting: Nurse Practitioner

## 2024-02-14 VITALS — BP 144/82 | HR 79 | Temp 97.9°F | Wt 148.4 lb

## 2024-02-14 DIAGNOSIS — R052 Subacute cough: Secondary | ICD-10-CM

## 2024-02-14 DIAGNOSIS — H66001 Acute suppurative otitis media without spontaneous rupture of ear drum, right ear: Secondary | ICD-10-CM

## 2024-02-14 MED ORDER — PREDNISONE 10 MG PO TABS: ORAL_TABLET | ORAL | 0 refills | Status: AC

## 2024-02-14 MED ORDER — HYDROCODONE BIT-HOMATROP MBR 5-1.5 MG/5ML PO SOLN: 5.0000 mL | Freq: Three times a day (TID) | ORAL | 0 refills | Status: AC | PRN

## 2024-02-14 MED ORDER — AZITHROMYCIN 250 MG PO TABS: ORAL_TABLET | ORAL | 0 refills | Status: AC

## 2024-02-14 NOTE — Telephone Encounter (Signed)
 FYI Only or Action Required?: FYI only for provider: appointment scheduled on 1.16.26.  Patient was last seen in primary care on 04/15/2023 by Bulah Alm RAMAN, PA-C.  Called Nurse Triage reporting Sore Throat, Otalgia, and Cough.  Symptoms began about a month ago.  Interventions attempted: OTC medications: zyrtec, nose spray and Prescription medications: promethazine  and amoxicillin.  Symptoms are: gradually worsening.  Triage Disposition: See Physician Within 24 Hours  Patient/caregiver understands and will follow disposition?: Yes

## 2024-02-14 NOTE — Patient Instructions (Signed)
Otitis media en los adultos Otitis Media, Adult  La otitis media es una afeccin que se caracteriza porque el odo medio est rojo e hinchado (inflamado) y lleno de lquido. El odo medio es la parte del odo que contiene los huesos de la audicin, as Neurosurgeon aire que ayuda a Corporate treasurer los sonidos al cerebro. Generalmente, la afeccin desaparece sin tratamiento. Cules son las causas? Esta afeccin es consecuencia de una obstruccin en la trompa de [place]. La trompa conecta el odo medio con la parte posterior de la [place]. Normalmente, permite que el aire entre en el [facility]. La causa de la obstruccin es el lquido o la hinchazn. Algunos de los problemas que pueden causar Neomia Dear obstruccin son los siguientes: Un resfro o infeccin que afecta la nariz, la boca o la garganta. Alergias. Un irritante, como el humo del tabaco. Adenoides que se han agrandado. Las adenoides son tejido blando ubicado en la parte posterior de la garganta, detrs de la nariz y Advice worker. Crecimiento o hinchazn en la parte superior de la garganta, justo detrs de la nariz (nasofaringe). Dao en el odo a causa de un cambio en la presin. Esto se denomina barotraumatismo. Qu incrementa el riesgo? Es ms probable que tenga esta afeccin si: Fuma o se expone al humo de tabaco. Tiene una abertura en la parte superior de la boca (hendidura del paladar). Tiene reflujo cido. Tiene problemas en el sistema de defensa del cuerpo (sistema inmunitario). Cules son los signos o sntomas? Los sntomas de esta afeccin incluyen: Dolor de odo. Grant Ruts. Problemas para or. Cansancio. Supuracin de lquido por el odo. Zumbidos en el odo. Cmo se trata? Esta afeccin puede desaparecer sin tratamiento en el transcurso de 3 a 5 das. Sin embargo, si la afeccin est causada por bacterias y no desaparece sin tratamiento, o si vuelve a aparecer ms de una vez, el mdico puede hacer lo siguiente: Recetarle  antibiticos. Administrarle analgsicos. Siga estas indicaciones en su casa: Use los medicamentos de venta libre y los recetados solamente como se lo haya indicado el mdico. Si le recetaron un antibitico, tmelo como se lo haya indicado el mdico. No deje de tomarlo aunque comience a sentirse mejor. Concurra a todas las visitas de seguimiento. Comunquese con un mdico si: Le sangra la nariz. Tiene un bulto en el cuello. No se siente mejor al cabo de 5 das. Empeora en lugar de mejorar. Solicite ayuda de inmediato si: Siente dolor que no se [employer]. Tiene hinchazn, enrojecimiento o dolor en el odo. Tiene rigidez en el cuello. No puede mover una parte de su rostro (parlisis). Nota que el hueso que se encuentra detrs de la oreja le duele al tocarlo. Siente un dolor de cabeza muy intenso. Resumen Otitis media significa que el odo medio est rojo, hinchado y lleno de lquido. Generalmente, esta afeccin desaparece sin tratamiento. Si el problema no desaparece, puede ser necesario Pensions consultant. Es posible que le den medicamentos para tratar la infeccin o para Corporate treasurer. Si le recetaron un antibitico, tmelo como se lo haya indicado el mdico. No deje de tomarlo aunque comience a sentirse mejor. Concurra a todas las visitas de seguimiento. Esta informacin no tiene Theme park manager el consejo del mdico. Asegrese de hacerle al mdico cualquier pregunta que tenga. Document Revised: 05/20/2020 Document Reviewed: 05/20/2020 Elsevier Patient Education  2024 [employer].

## 2024-02-14 NOTE — Progress Notes (Signed)
 "  Andrea Mclaughlin Doing, DNP, AGNP-c [facility] [address] [place], [place] [address] [phone]   ACUTE VISIT : Acute or New Concern Visit on 02/14/2024  Last menstrual period 08/21/2013.   Subjective:  No chief complaint on file. History of Present Illness Andrea Mclaughlin is a 59 year old female with diabetes who presents with persistent cough, ear pain, and throat pain.  Her symptoms began approximately four weeks ago with general malaise. She was initially seen by a UC on December 28th. No strep/covid/flu completed. She was prescribed a seven-day course of amoxicillin and promethazine  for her cough. She completed the antibiotic course, but the promethazine  was ineffective, and she did not finish it. She also used over-the-counter medications like Claritin and a nasal spray for congestion.  Despite initial improvement, her symptoms have worsened. She now experiences sharp pain in her right ear, a return of throat pain, and a persistent cough that disrupts her sleep. No fever or body aches are present, but there is significant discomfort in her ear and throat. The cough is particularly troublesome at night, preventing her from sleeping.  She has a history of diabetes, which complicates her treatment options. She has previously taken steroids, but they elevate her blood sugar levels significantly. She recalls an instance where a steroid injection for arthritis caused her blood sugar to spike to 600 mg/dL, although she tolerates oral steroids better. She is cautious about using steroids due to this history, but is open to trial of a low dose burst.   In the review of symptoms, she reports coughing up a small amount of sputum, but primarily feels the discomfort in her throat and ear.  She is here with her daughter today.   ROS negative except for what is listed in HPI. History, Medications, Surgery, SDOH, and Family History reviewed and updated as appropriate.  Objective:   Physical Exam Vitals and nursing note reviewed.  Constitutional:      General: She is not in acute distress.    Appearance: Normal appearance.  HENT:     Head: Normocephalic.     Ears:     Comments: Clear effusion present on the left. Erythema, purulent effusion, and bulging TM on the right.     Nose: Congestion present.     Mouth/Throat:     Mouth: Mucous membranes are moist.     Pharynx: Posterior oropharyngeal erythema present. No oropharyngeal exudate.  Eyes:     Conjunctiva/sclera: Conjunctivae normal.  Neck:     Vascular: No carotid bruit.  Cardiovascular:     Rate and Rhythm: Normal rate and regular rhythm.     Pulses: Normal pulses.     Heart sounds: Normal heart sounds.  Pulmonary:     Effort: Pulmonary effort is normal. No respiratory distress.     Breath sounds: Wheezing present. No rhonchi or rales.  Musculoskeletal:     Cervical back: Neck supple. No tenderness.  Lymphadenopathy:     Cervical: Cervical adenopathy present.  Skin:    General: Skin is warm and dry.     Capillary Refill: Capillary refill takes less than 2 seconds.  Neurological:     Mental Status: She is alert and oriented to person, place, and time.     Sensory: No sensory deficit.     Motor: No weakness.     Gait: Gait normal.  Psychiatric:        Mood and Affect: Mood normal.         Assessment &  Plan:   Assessment & Plan Non-recurrent acute suppurative otitis media of right ear without spontaneous rupture of tympanic membrane Sharp pain in the right ear with confirmed infection. Previous treatment with amoxicillin was ineffective. No signs of strep throat, flu, or COVID-19. Azithromycin  chosen for coverage. Prednisone  considered for inflammation, with caution due to potential hyperglycemia.We discussed that she can stop this if her blood sugars start to go up.  - Prescribed azithromycin  for broader bacterial coverage. - Prescribed low-dose prednisone  (5 mg) for 5 days to reduce  inflammation, with instructions to discontinue if blood sugar levels spike. Orders:   azithromycin  (ZITHROMAX ) 250 MG tablet; Take 2 tablets on day 1, then 1 tablet daily on days 2 through 5   predniSONE  (DELTASONE ) 10 MG tablet; Take 1 tablet by mouth in the morning for 5 days.  Subacute cough Persistent cough for several weeks, not relieved by promethazine . Cough is likely reactive due to ear infection and inflammation. No crackles in lungs, mild wheezing is noted without distress. Cough syrup with codeine prescribed to aid sleep and reduce coughing. Will also trial low dose of prednisone  burst to help with inflammatory process and quiet cough.  - Prescribed cough syrup with codeine to aid sleep and reduce coughing. Orders:   HYDROcodone  bit-homatropine (HYCODAN) 5-1.5 MG/5ML syrup; Take 5 mLs by mouth every 8 (eight) hours as needed for cough.   predniSONE  (DELTASONE ) 10 MG tablet; Take 1 tablet by mouth in the morning for 5 days.    Andrea Mclaughlin E Andrea Bran, DNP, AGNP-c       "

## 2024-02-14 NOTE — Telephone Encounter (Signed)
" ° ° ° °  Pt got sick a month ago and a week after she went to urgent care and was given antibiotics and cough drops. It feels like knives in her throat, and cough and it won't let her sleep , ears hurt really bad.    Reason for Disposition  [1] Continuous (nonstop) coughing interferes with work or school AND [2] no improvement using cough treatment per Care Advice  Answer Assessment - Initial Assessment Questions Cough, sore throat about 3-4 weeks ago, went to UC. They prescribed her amoxicillin (finished), zyrtec, promethazine , nose spray. She states now has bilateral ear pain, cough -worse at night and causing her not to sleep well, sore throat. She states she does get short of breath and has chest pain with coughing only. State promethazine  didn't really help her cough.     1. ONSET: When did the cough begin?      3-4 weeks ago 2. SEVERITY: How bad is the cough today?      Bad at night 3. SPUTUM: Describe the color of your sputum (e.g., none, dry cough; clear, white, yellow, green)     States mostly dry 4. HEMOPTYSIS: Are you coughing up any blood? If Yes, ask: How much? (e.g., flecks, streaks, tablespoons, etc.)     denies 5. DIFFICULTY BREATHING: Are you having difficulty breathing? If Yes, ask: How bad is it? (e.g., mild, moderate, severe)      With coughing spells 6. FEVER: Do you have a fever? If Yes, ask: What is your temperature, how was it measured, and when did it start?     Hasn't measured- but feels cold at times 7. CARDIAC HISTORY: Do you have any history of heart disease? (e.g., heart attack, congestive heart failure)      no 8. LUNG HISTORY: Do you have any history of lung disease?  (e.g., pulmonary embolus, asthma, emphysema)     no 9. PE RISK FACTORS: Do you have a history of blood clots? (or: recent major surgery, recent prolonged travel, bedridden)     denies 10. OTHER SYMPTOMS: Do you have any other symptoms? (e.g., runny nose, wheezing,  chest pain)  Protocols used: Cough - Acute Non-Productive-A-AH  "

## 2024-04-21 ENCOUNTER — Encounter: Payer: Self-pay | Admitting: Medical
